# Patient Record
Sex: Female | Born: 1950 | Race: Black or African American | Hispanic: No | Marital: Married | Smoking: Never smoker
Health system: Southern US, Community
[De-identification: ages and names within clinical notes are randomized; demographics above are authoritative.]

## PROBLEM LIST (undated history)

## (undated) DIAGNOSIS — T8859XA Other complications of anesthesia, initial encounter: Secondary | ICD-10-CM

## (undated) DIAGNOSIS — I10 Essential (primary) hypertension: Secondary | ICD-10-CM

## (undated) DIAGNOSIS — Z9889 Other specified postprocedural states: Secondary | ICD-10-CM

## (undated) DIAGNOSIS — R5383 Other fatigue: Secondary | ICD-10-CM

## (undated) DIAGNOSIS — M1990 Unspecified osteoarthritis, unspecified site: Secondary | ICD-10-CM

## (undated) HISTORY — DX: Essential (primary) hypertension: I10

## (undated) HISTORY — DX: Other fatigue: R53.83

---

## 1989-05-01 HISTORY — PX: OTHER SURGICAL HISTORY: SHX169

## 1989-05-01 HISTORY — PX: ENDOSCOPIC EXTRALARYNGEAL VOCAL CORD LATERALIZATION W/ MLB: SHX1502

## 2010-08-30 ENCOUNTER — Ambulatory Visit (INDEPENDENT_AMBULATORY_CARE_PROVIDER_SITE_OTHER): Payer: BC Managed Care – PPO | Admitting: Internal Medicine

## 2010-08-30 DIAGNOSIS — Z800 Family history of malignant neoplasm of digestive organs: Secondary | ICD-10-CM

## 2010-08-30 DIAGNOSIS — K5900 Constipation, unspecified: Secondary | ICD-10-CM

## 2010-08-30 DIAGNOSIS — K6289 Other specified diseases of anus and rectum: Secondary | ICD-10-CM

## 2010-09-18 NOTE — Consult Note (Addendum)
Tara Sutton, Tara Sutton             ACCOUNT NO.:  [id number]  MEDICAL RECORD NO.:  [id number]           PATIENT TYPE:  LOCATION:                                 FACILITY:  PHYSICIAN:  Lionel December, M.D.    DATE OF BIRTH:  18-Jun-1950  DATE OF CONSULTATION:  08/30/2010 DATE OF DISCHARGE:                                CONSULTATION   REASON FOR CONSULTATION:  Constipation and pain when sitting.  HISTORY OF PRESENT ILLNESS:  Tara Sutton is a 60 year old female referred to our office by Dr. Sherril Croon.  She states she is having a hard time sitting on a metal chair.  She says she has discomfort in her rectum when seated. She has had symptoms for 7-9 months.  She says the pain or discomfort is better when she walks.  She denies any rectal bleeding.  She does state she had 1 black stool about 4-5 months ago when she was constipated and that has since resolved.  She did have constipation several months ago and was started on Metamucil which has helped her constipation.  On July 18, 2010, she underwent a pelvic ultrasound for pain when sitting. The ultrasound was limited.  The patient refused a transvaginal ultrasound.  Neither ovary could be visualized.  The uterus was normal. Her last colonoscopy was in 2007 by Dr. Cleotis Nipper for screening purposes.  It revealed no colonic or rectal polyps. Internal hemorrhoids.  No diverticulosis, otherwise normal colonoscopy.  There is a family history of colon cancer. Her mother's sister had colon cancer.  REVIEW OF SYSTEMS:  She denies fever, fatigue.  There has been no weight loss.  Her appetite is good.  Her constipation is better now with a Metamucil.  She was guaiac-negative in Dr. Uvaldo Bristle office.  She recently had a normal Pap smear in February.    ALLERGies: PENICILLIN.  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  She does have a large scar to her right side of the neck.  Medical history includes  hypertension.  FAMILY HISTORY:  Her mother is alive with Parkinson's, otherwise in good health.  Her father is alive in good health with arthritis.  One sister and one brother in good health and her mother, sister has had colon cancer.  SOCIAL HISTORY:  She is married.  She works at [employer].  She does not smoke, drink or do drugs.  She has 3 children in good health.  HOME MEDICATIONS:  Include 1. Hydrochlorothiazide 25 mg a day. 2. Quinapril 40 mg a day. 3. MVI one a day. 4. Aspirin 81 mg a day. 5. Metamucil once a day.  OBJECTIVE:  VITAL SIGNS:  Her weight is 204, her height is 5 feet 5 inches, her BMI is 35, her temperature is 97.3, her blood pressure is 104/76, pulse 72. HEENT:  She has natural teeth in good condition.  Her oral mucosa is moist.  Her conjunctivae is pink.  Sclerae anicteric.  Her thyroid is normal.  There is no cervical lymphadenopathy.  She does have a large scar to the right side of her neck. LUNGS:  Clear. HEART:  Regular rate and rhythm. ABDOMEN:  Soft.  Bowel sounds are positive.  No masses felt.  No hepatosplenomegaly. RECTUM:  Her stool was brown guaiac-negative.  She has normal anal sphincter tone.  No masses felt in the rectum.  LABORATORY DATA:  July 10, 2010, her glucose 98, BUN 19, creatinine 0.99, sodium 141, potassium 3.7, chloride 97, carbon dioxide total 24, calcium 9.6, protein 7.4, albumin 4.5, globulin 2.9, total bilirubin 0.4, ALP 107, AST 20, ALT 18.  WBC count 4.5, RBC 4.15, hemoglobin 12.6, hematocrit 36.0, MCV 87, platelets 259.  Cholesterol was normal at 196, triglycerides 59, HDL was 78, LDL was slightly high at 106.  OBJECTIVE:  Tara Sutton is a 60 year old female presenting today with complaints of pain when she sits, especially in a metal chair.  There is no pain when she walks.  She does have this pain every day.  Her last colonoscopy was in 2007 which was essentially normal.  There is, however, a family history of colonic  cancer.  A rectal carcinoma, AVM or colonic polyp need to be ruled out.  Internal hemorrhoids is also in the differential.  RECOMMENDATIONS:  We will schedule a colonoscopy in near future.  The risk and benefits were reviewed with the patient and she is agreeable. I also recommend her to try Anusol suppositories for her rectal pain until we can get the colonoscopy.  Thank you for allowing [place] to participate in the care of this nice lady.    ______________________________ Dorene Ar, NP   ______________________________ Lionel December, M.D.    TS/MEDQ  D:  08/30/2010  T:  08/31/2010  Job:  [id number]  cc:   Dr. Trisha Mangle  Electronically Signed by Dorene Ar PA on 09/23/2010 09:56:19 AM Electronically Signed by Lionel December M.D. on 10/18/2010 09:28:31 AM

## 2010-09-22 ENCOUNTER — Ambulatory Visit (HOSPITAL_COMMUNITY)
Admission: RE | Admit: 2010-09-22 | Discharge: 2010-09-22 | Disposition: A | Discharge status: Home / Self Care | Payer: BC Managed Care – PPO | Source: Ambulatory Visit | Attending: Internal Medicine | Admitting: Internal Medicine

## 2010-09-22 ENCOUNTER — Encounter (HOSPITAL_BASED_OUTPATIENT_CLINIC_OR_DEPARTMENT_OTHER): Payer: BC Managed Care – PPO | Admitting: Internal Medicine

## 2010-09-22 DIAGNOSIS — Z79899 Other long term (current) drug therapy: Secondary | ICD-10-CM | POA: Insufficient documentation

## 2010-09-22 DIAGNOSIS — K5909 Other constipation: Secondary | ICD-10-CM | POA: Insufficient documentation

## 2010-09-22 DIAGNOSIS — K5900 Constipation, unspecified: Secondary | ICD-10-CM

## 2010-09-22 DIAGNOSIS — I10 Essential (primary) hypertension: Secondary | ICD-10-CM | POA: Insufficient documentation

## 2010-09-22 DIAGNOSIS — K6289 Other specified diseases of anus and rectum: Secondary | ICD-10-CM

## 2010-09-22 DIAGNOSIS — Z800 Family history of malignant neoplasm of digestive organs: Secondary | ICD-10-CM

## 2010-09-22 DIAGNOSIS — Z7982 Long term (current) use of aspirin: Secondary | ICD-10-CM | POA: Insufficient documentation

## 2010-09-22 DIAGNOSIS — K644 Residual hemorrhoidal skin tags: Secondary | ICD-10-CM | POA: Insufficient documentation

## 2010-10-18 NOTE — Op Note (Signed)
  Tara Sutton, Tara Sutton             ACCOUNT NO.:  [id number]  MEDICAL RECORD NO.:  [id number]           PATIENT TYPE:  O  LOCATION:  [facility]                          FACILITY:  [facility]  PHYSICIAN:  Lionel December, M.D.    DATE OF BIRTH:  25-Sep-1950  DATE OF PROCEDURE:  09/22/2010 DATE OF DISCHARGE:                              OPERATIVE REPORT   PROCEDURE:  Colonoscopy.  INDICATION:  Vibha 60 year old [place] American female who presents with constipation and rectal pain.  Her last colonoscopy was over 5 years ago.  Family history is positive for colon carcinoma, but in her second- degree relatives, her paternal grandmother had colon carcinoma, probably around 41, but she had a maternal aunt died of colon carcinoma at 89 or 55.  She is undergoing colonoscopy primarily for diagnostic reasons. Procedures were reviewed with the patient.  Informed consent was obtained.  MEDS FOR CONSCIOUS SEDATION:  Demerol 50 mg IV, Versed 5 mg IV in divided dose.  FINDINGS:  Procedure performed in endoscopy suite.  The patient's vital signs and O2 sat were monitored during the procedure and remained stable.  The patient was placed in left lateral recumbent position and rectal examination performed.  No abnormality noted on external or digital exam.  Pentax videoscope was placed through rectum and advanced under vision into sigmoid colon and beyond.  Preparation was excellent. Somewhat dilated colon.  Redundant hepatic flexure, but the scope was advanced into cecum which was identified by appendiceal orifice and ileocecal valve.  Pictures were taken for the record.  As the scope was withdrawn, colonic mucosa was carefully examined.  No polyps, tumor masses, or other abnormalities were noted.  Rectal mucosa was normal. Scope was retroflexed to examine anorectal junction and small hemorrhoids noted below the dentate line.  Endoscope was withdrawn. Withdrawal time was 7 minutes.  The patient tolerated the  procedure well.  FINAL DIAGNOSES: 1. Examination performed to cecum. 2. Redundant hepatic flexure and small external hemorrhoids, otherwise     normal examination.  RECOMMENDATION:  She will continue high-fiber diet, Metamucil daily.  If need maybe she can take Colace 2 tablets daily at bedtime.  If above measures do not help, she may need to take MiraLax.  She may consider next exam in 7-10 years.          ______________________________ Lionel December, M.D.     NR/MEDQ  D:  09/22/2010  T:  09/23/2010  Job:  [id number]  cc:   Dr. Trisha Mangle  Electronically Signed by Lionel December M.D. on 10/18/2010 09:30:57 AM

## 2017-03-14 ENCOUNTER — Encounter: Payer: Self-pay | Admitting: Vascular Surgery

## 2017-05-14 ENCOUNTER — Ambulatory Visit: Payer: 59 | Admitting: Vascular Surgery

## 2017-05-14 ENCOUNTER — Encounter: Payer: Self-pay | Admitting: Vascular Surgery

## 2017-05-14 VITALS — BP 130/84 | HR 77 | Temp 98.2°F | Resp 18 | Ht 64.0 in | Wt 204.5 lb

## 2017-05-14 DIAGNOSIS — I83893 Varicose veins of bilateral lower extremities with other complications: Secondary | ICD-10-CM | POA: Diagnosis not present

## 2017-05-14 NOTE — Progress Notes (Signed)
Subjective:     Patient ID: Tara Sutton, female   DOB: [birth year], 67 y.o.   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 The patient has had no previous treatment. The patient has no history of DVT thrombophlebitis stasis ulcers or bleeding. She does not develop swelling as the day progresses. She does not really last compression stockings. She works [employer] and is on her feet most of the day. She has noticed increasingly prominent veins in both legs over the past few years. She denies any pain or symptoms related to these. She has had no previous treatment.  Past Medical History:  Diagnosis Date  . Fatigue   . Hypertension     Social History   Tobacco Use  . Smoking status: Never Smoker  . Smokeless tobacco: Never Used  Substance Use Topics  . Alcohol use: No    Frequency: Never    Family History  Problem Relation Age of Onset  . Diabetes Mother   . Cancer Mother   . Parkinsonism Mother   . Diabetes Sister     Allergies  Allergen Reactions  . Penicillins      Current Outpatient Medications:  .  aspirin EC 81 MG tablet, Take 81 mg daily by mouth., Disp: , Rfl:  .  docusate sodium (COLACE) 100 MG capsule, Take 100 mg 2 (two) times daily by mouth., Disp: , Rfl:  .  hydrochlorothiazide (HYDRODIURIL) 25 MG tablet, Take 25 mg daily by mouth., Disp: , Rfl:  .  meclizine (ANTIVERT) 25 MG tablet, Take 25 mg 3 (three) times daily as needed by mouth for dizziness., Disp: , Rfl:  .  Multiple Vitamin (MULTIVITAMIN) capsule, Take 1 capsule daily by mouth., Disp: , Rfl:  .  quinapril (ACCUPRIL) 40 MG tablet, Take 40 mg at bedtime by mouth., Disp: , Rfl:  .  vitamin B-12 (CYANOCOBALAMIN) 1000 MCG tablet, Take 1,000 mcg daily by mouth., Disp: , Rfl:   Vitals:   05/14/17 1306  BP: 130/84  Pulse: 77  Resp: 18  Temp: 98.2 F (36.8 C)  TempSrc: Oral  Weight: 204 lb 8 oz (92.8 kg)  Height: 5\' 4"   (1.626 m)    Body mass index is 35.1 kg/m.         Review of Systems  Denies chest pain, dyspnea on exertion, PND, orthopnea, hemoptysis, claudication. Has history of hypertension treated medically.    Objective:   Physical Exam BP 130/84 (BP Location: Left Arm, Patient Position: Sitting, Cuff Size: Large)   Pulse 77   Temp 98.2 F (36.8 C) (Oral)   Resp 18   Ht 5\' 4"  (1.626 m)   Wt 204 lb 8 oz (92.8 kg)   BMI 35.10 kg/m     Gen.-alert and oriented x3 in no apparent distress HEENT normal for age Lungs no rhonchi or wheezing Cardiovascular regular rhythm no murmurs carotid pulses 3+ palpable no bruits audible Abdomen soft nontender no palpable masses Musculoskeletal free of  major deformities Skin clear -no rashes Neurologic normal Lower extremities 3+ femoral and dorsalis pedis pulses palpable bilaterally with no edema Patient has diffuse spider and small varicosities bilaterally in the left leg in the patellar area and bilateral medial and lateral thigh areas. No hyperpigmentation or ulceration noted. No large bulging varicosities noted.  Today I performed a bedside SonoSite ultrasound exam. Both great saphenous and small saphenous veins appear normal in size with no evidence of reflux or  enlargement.     Assessment:     Bilateral small varicosities and spider telangiectasias-asymptomatic with no evidence of gross reflux in bilateral great or small saphenous veins Hypertension treated medically    Plan:     Discussed with patient the treatment would not be medically indicated but if she desires treatment it would consist of foam sclerotherapy. She was given contact information for [facility] if she should decide she was to pursue this further

## 2017-05-16 ENCOUNTER — Encounter: Payer: Self-pay | Admitting: Nurse Practitioner

## 2017-08-01 ENCOUNTER — Ambulatory Visit (HOSPITAL_COMMUNITY)
Admission: RE | Admit: 2017-08-01 | Discharge: 2017-08-01 | Disposition: A | Discharge status: Home / Self Care | Payer: 59 | Source: Ambulatory Visit | Attending: Nurse Practitioner | Admitting: Nurse Practitioner

## 2017-08-01 ENCOUNTER — Other Ambulatory Visit (HOSPITAL_COMMUNITY): Payer: Self-pay | Admitting: Nurse Practitioner

## 2017-08-01 ENCOUNTER — Other Ambulatory Visit (HOSPITAL_COMMUNITY)
Admission: RE | Admit: 2017-08-01 | Discharge: 2017-08-01 | Disposition: A | Discharge status: Home / Self Care | Payer: 59 | Source: Ambulatory Visit | Attending: Internal Medicine | Admitting: Internal Medicine

## 2017-08-01 DIAGNOSIS — M1711 Unilateral primary osteoarthritis, right knee: Secondary | ICD-10-CM | POA: Diagnosis not present

## 2017-08-01 DIAGNOSIS — M25561 Pain in right knee: Secondary | ICD-10-CM

## 2017-08-01 DIAGNOSIS — M79604 Pain in right leg: Secondary | ICD-10-CM | POA: Insufficient documentation

## 2017-08-01 DIAGNOSIS — M79661 Pain in right lower leg: Secondary | ICD-10-CM

## 2017-08-01 DIAGNOSIS — R69 Illness, unspecified: Secondary | ICD-10-CM | POA: Insufficient documentation

## 2017-08-01 DIAGNOSIS — R600 Localized edema: Secondary | ICD-10-CM

## 2018-06-28 DIAGNOSIS — E7800 Pure hypercholesterolemia, unspecified: Secondary | ICD-10-CM | POA: Diagnosis not present

## 2018-06-28 DIAGNOSIS — I10 Essential (primary) hypertension: Secondary | ICD-10-CM | POA: Diagnosis not present

## 2018-06-28 DIAGNOSIS — I8393 Asymptomatic varicose veins of bilateral lower extremities: Secondary | ICD-10-CM | POA: Diagnosis not present

## 2018-06-28 DIAGNOSIS — Z299 Encounter for prophylactic measures, unspecified: Secondary | ICD-10-CM | POA: Diagnosis not present

## 2018-06-28 DIAGNOSIS — Z6835 Body mass index (BMI) 35.0-35.9, adult: Secondary | ICD-10-CM | POA: Diagnosis not present

## 2018-10-03 IMAGING — US US EXTREM LOW VENOUS*R*
1 series · 13 of 24 positions shown · non-contrast
Comparison: None.

CLINICAL DATA: Right lower extremity pain and edema since this
morning. Evaluate for DVT.



[Series 1: us extrem low venous*right* · 0.08mm/px · 13 of 35 slices shown]
[im 1/35]
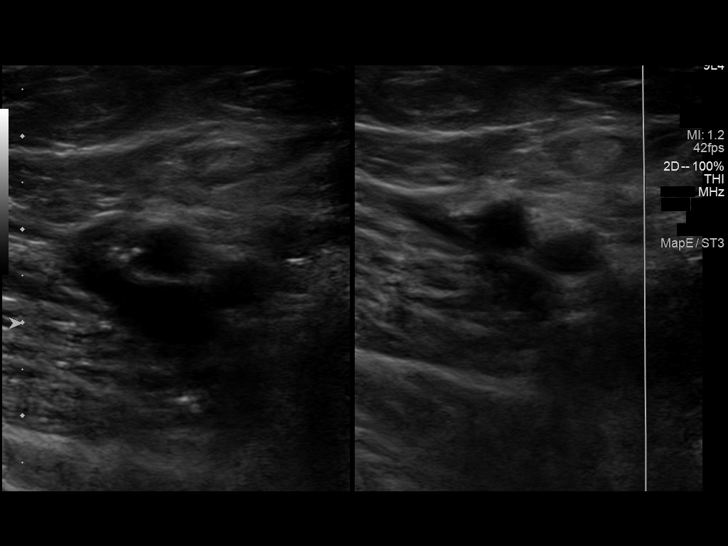
[im 3/35]
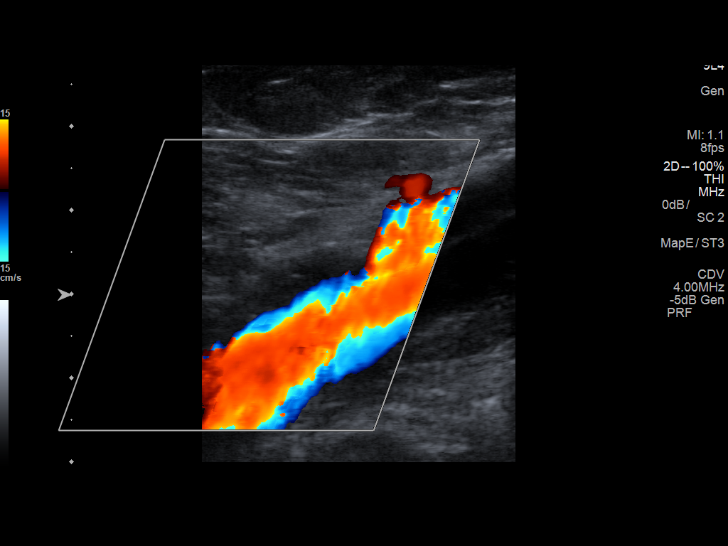
[im 6/35]
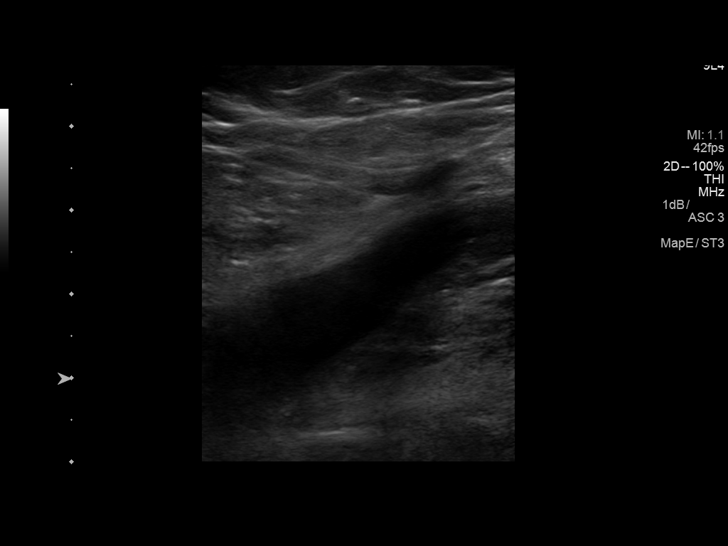
[im 9/35]
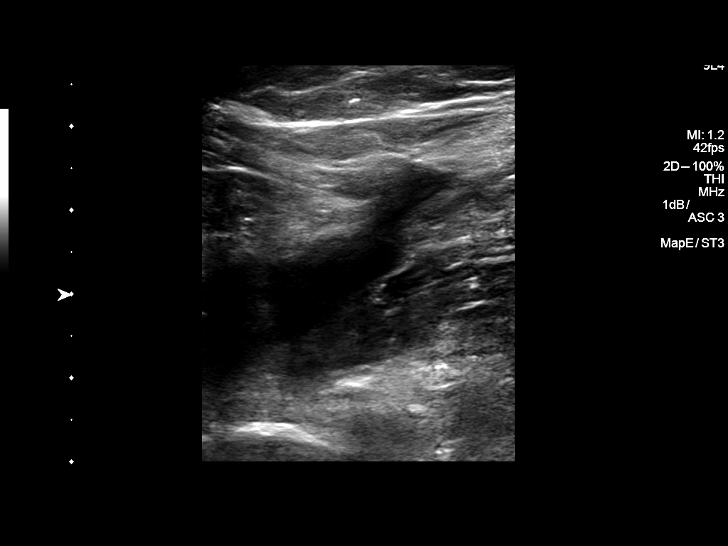
[im 12/35]
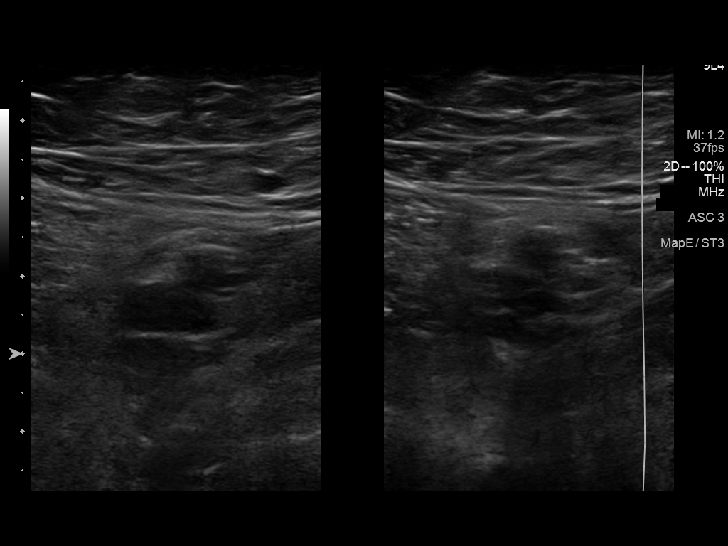
[im 15/35]
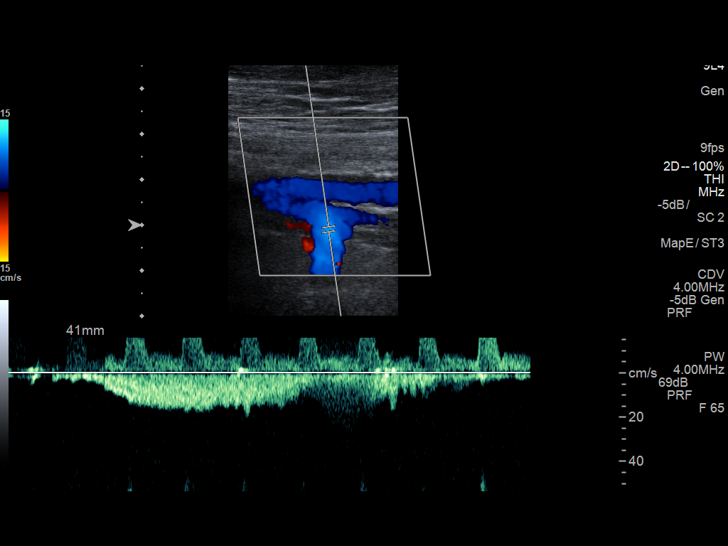
[im 18/35]
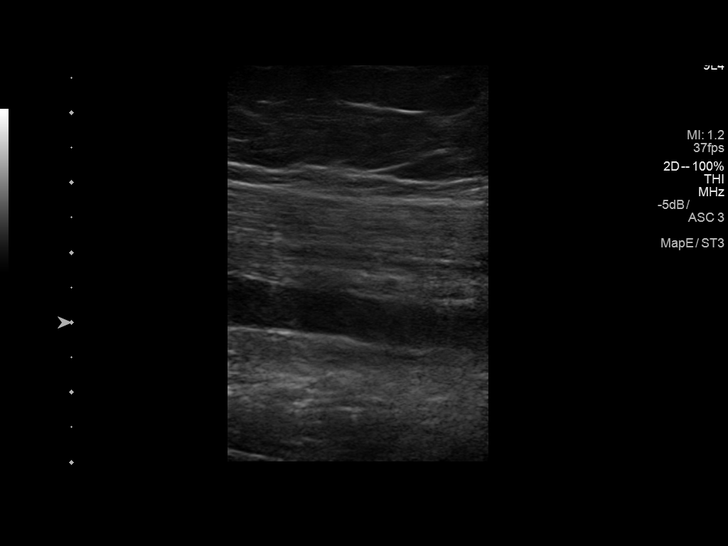
[im 20/35]
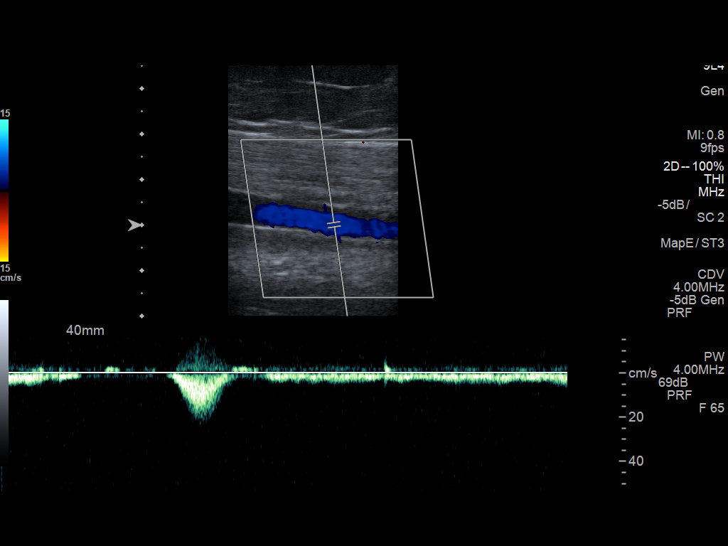
[im 23/35]
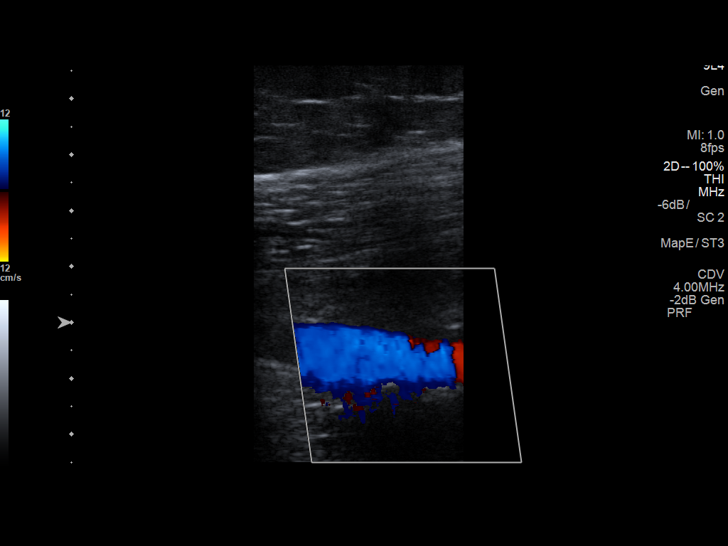
[im 26/35]
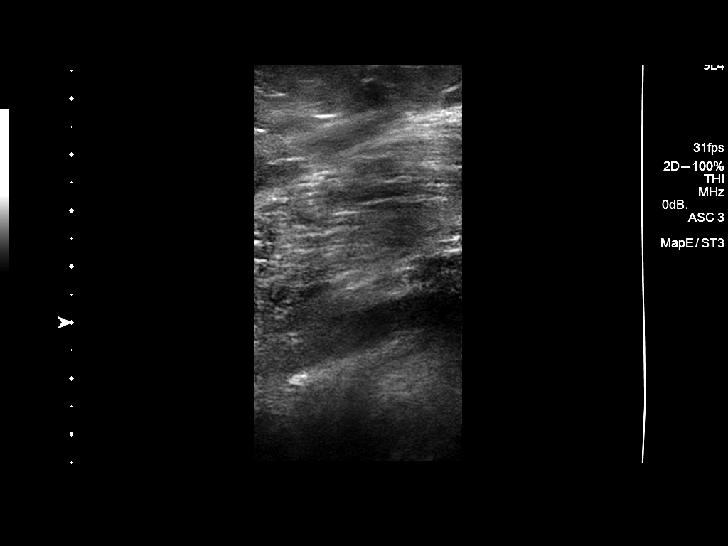
[im 29/35]
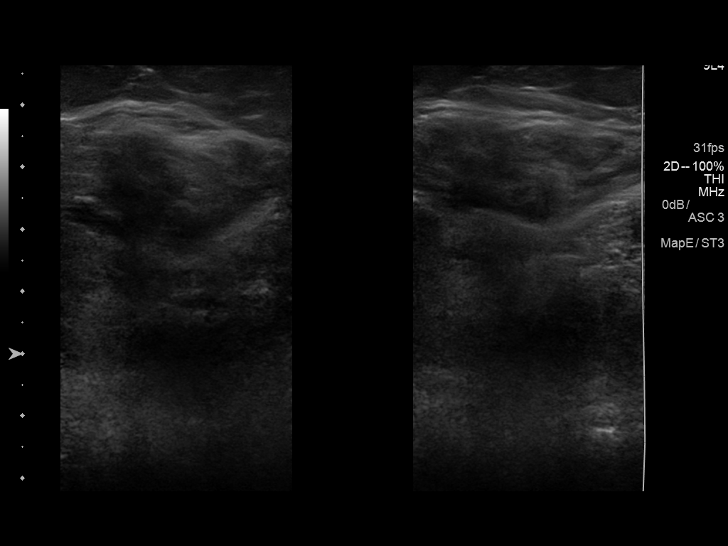
[im 32/35]
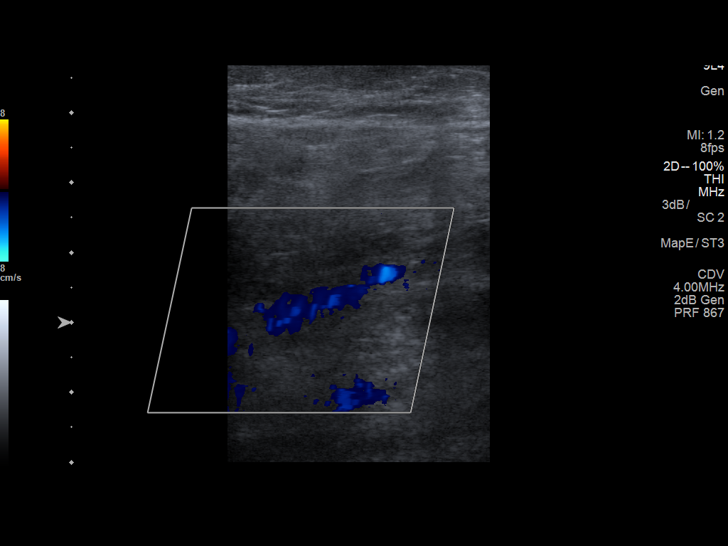
[im 35/35]
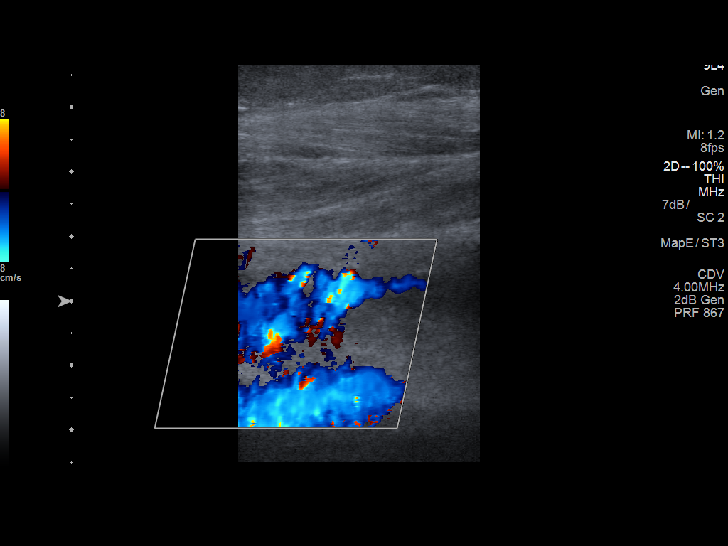

[13 of 24 positions shown; findings below may reference images not displayed]

FINDINGS: Contralateral Common Femoral Vein: Respiratory phasicity is normal
and symmetric with the symptomatic side. No evidence of thrombus.
Normal compressibility.

Common Femoral Vein: No evidence of thrombus. Normal
compressibility, respiratory phasicity and response to augmentation.

Saphenofemoral Junction: No evidence of thrombus. Normal
compressibility and flow on color Doppler imaging.

Profunda Femoral Vein: No evidence of thrombus. Normal
compressibility and flow on color Doppler imaging.

Femoral Vein: No evidence of thrombus. Normal compressibility,
respiratory phasicity and response to augmentation.

Popliteal Vein: No evidence of thrombus. Normal compressibility,
respiratory phasicity and response to augmentation.

Calf Veins: No evidence of thrombus. Normal compressibility and flow
on color Doppler imaging.

Superficial Great Saphenous Vein: No evidence of thrombus. Normal
compressibility.

Venous Reflux:  None.

Other Findings:  None.
IMPRESSION: No evidence of DVT within the right lower extremity.

## 2018-10-30 DIAGNOSIS — Z0000 Encounter for general adult medical examination without abnormal findings: Secondary | ICD-10-CM | POA: Diagnosis not present

## 2018-10-30 DIAGNOSIS — E7800 Pure hypercholesterolemia, unspecified: Secondary | ICD-10-CM | POA: Diagnosis not present

## 2018-10-30 DIAGNOSIS — Z79899 Other long term (current) drug therapy: Secondary | ICD-10-CM | POA: Diagnosis not present

## 2018-11-15 DIAGNOSIS — Z1231 Encounter for screening mammogram for malignant neoplasm of breast: Secondary | ICD-10-CM | POA: Diagnosis not present

## 2019-03-16 IMAGING — DX DG KNEE COMPLETE 4+V*R*
4 series · 4 of 4 positions shown · non-contrast
Comparison: None.

CLINICAL DATA: Right knee pain for several hours, no known injury,
initial encounter

EXAM:
RIGHT KNEE - COMPLETE 4+ VIEW

[knee ap (1 of 3)]
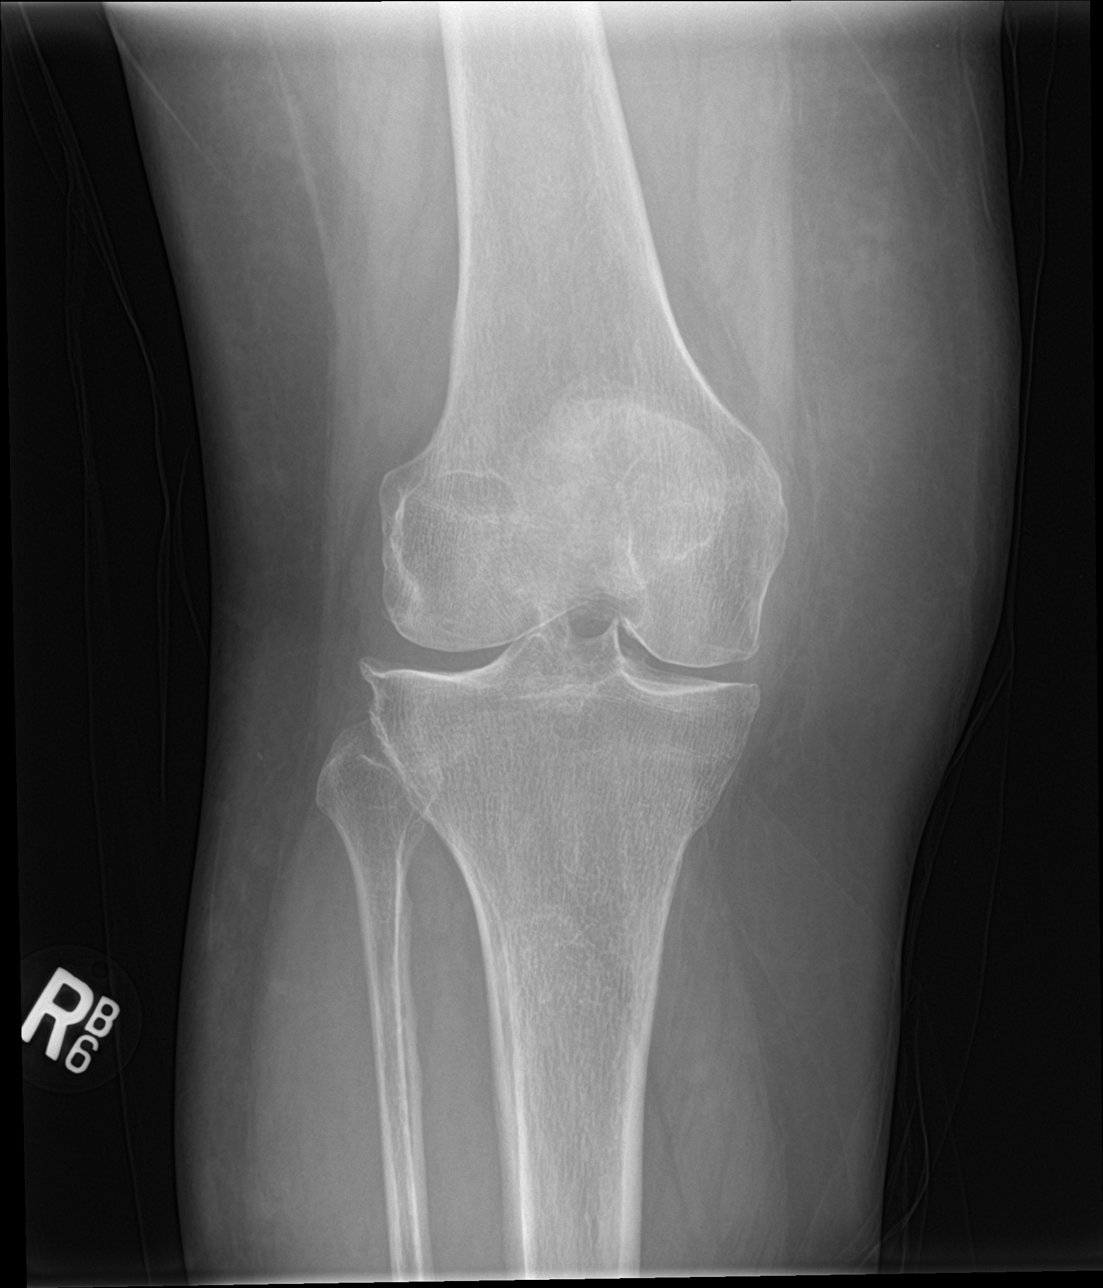

[knee lat]
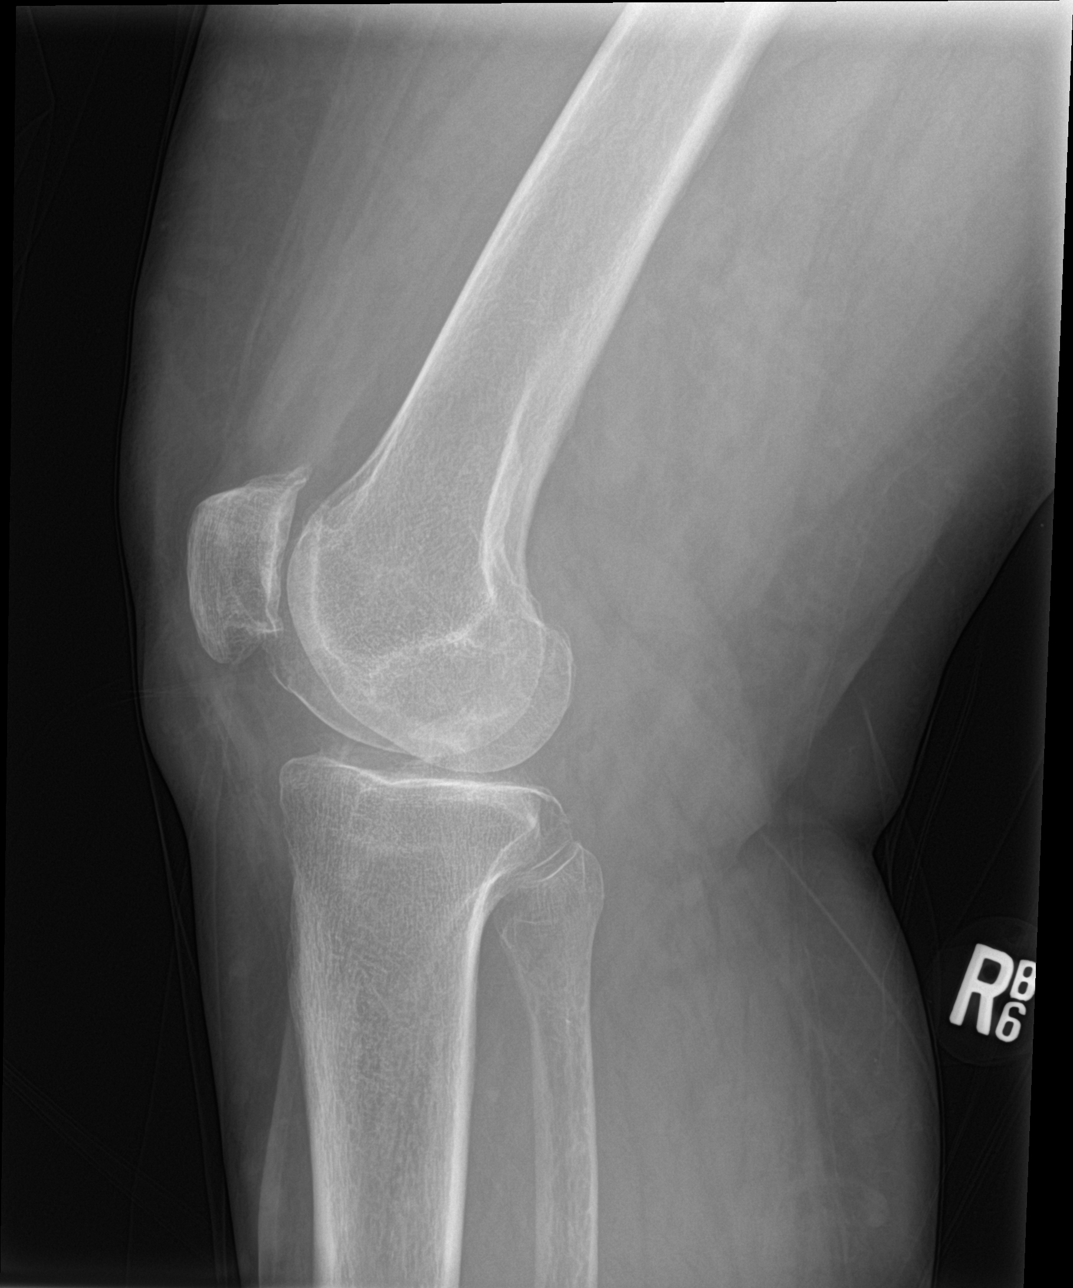

[knee ap (2 of 3)]
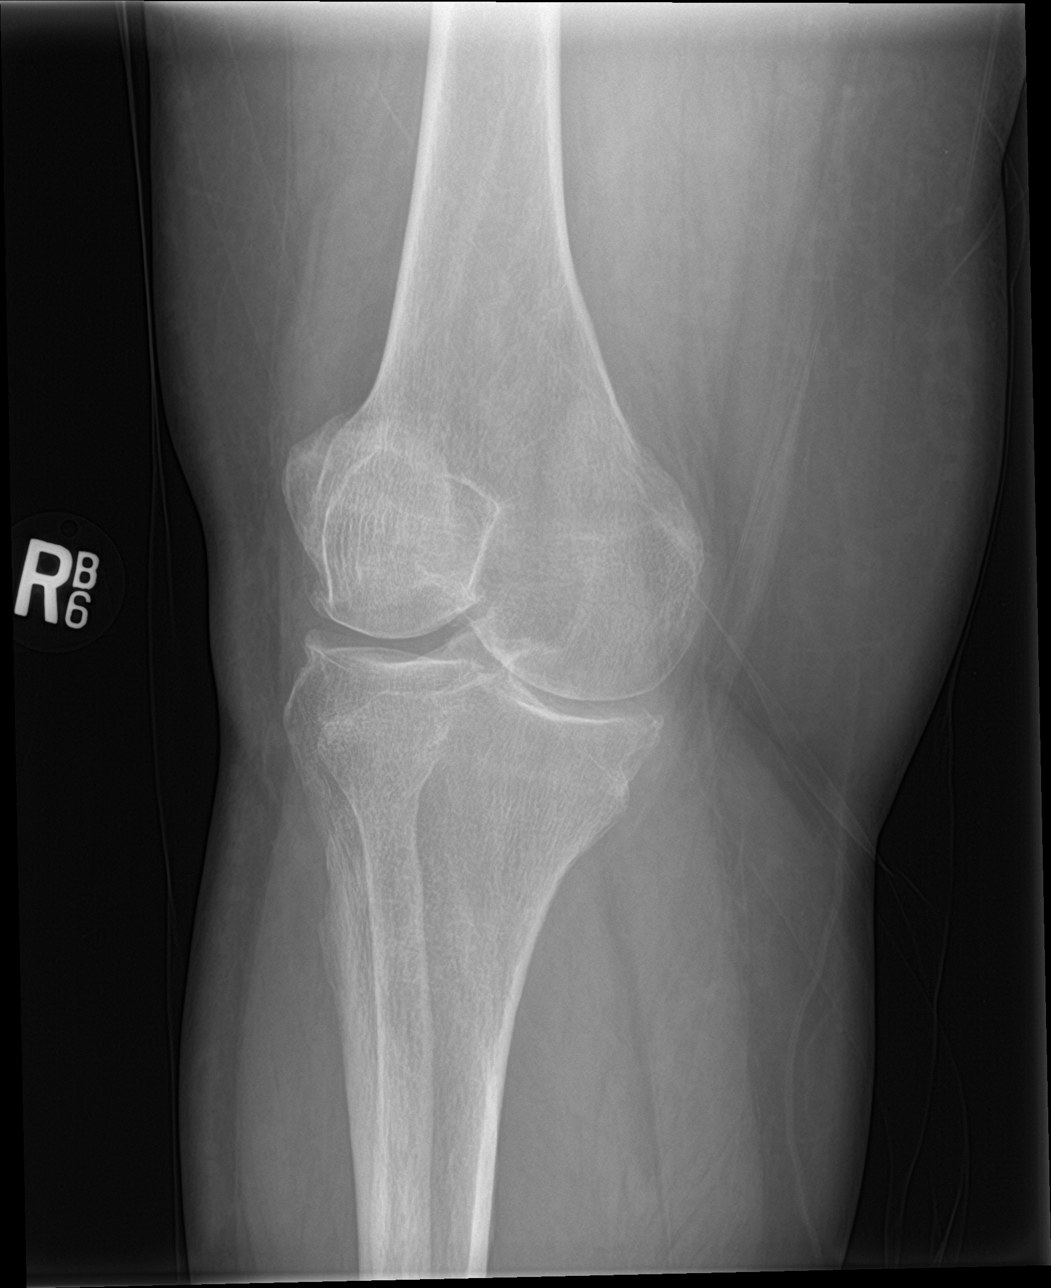

[knee ap (3 of 3)]
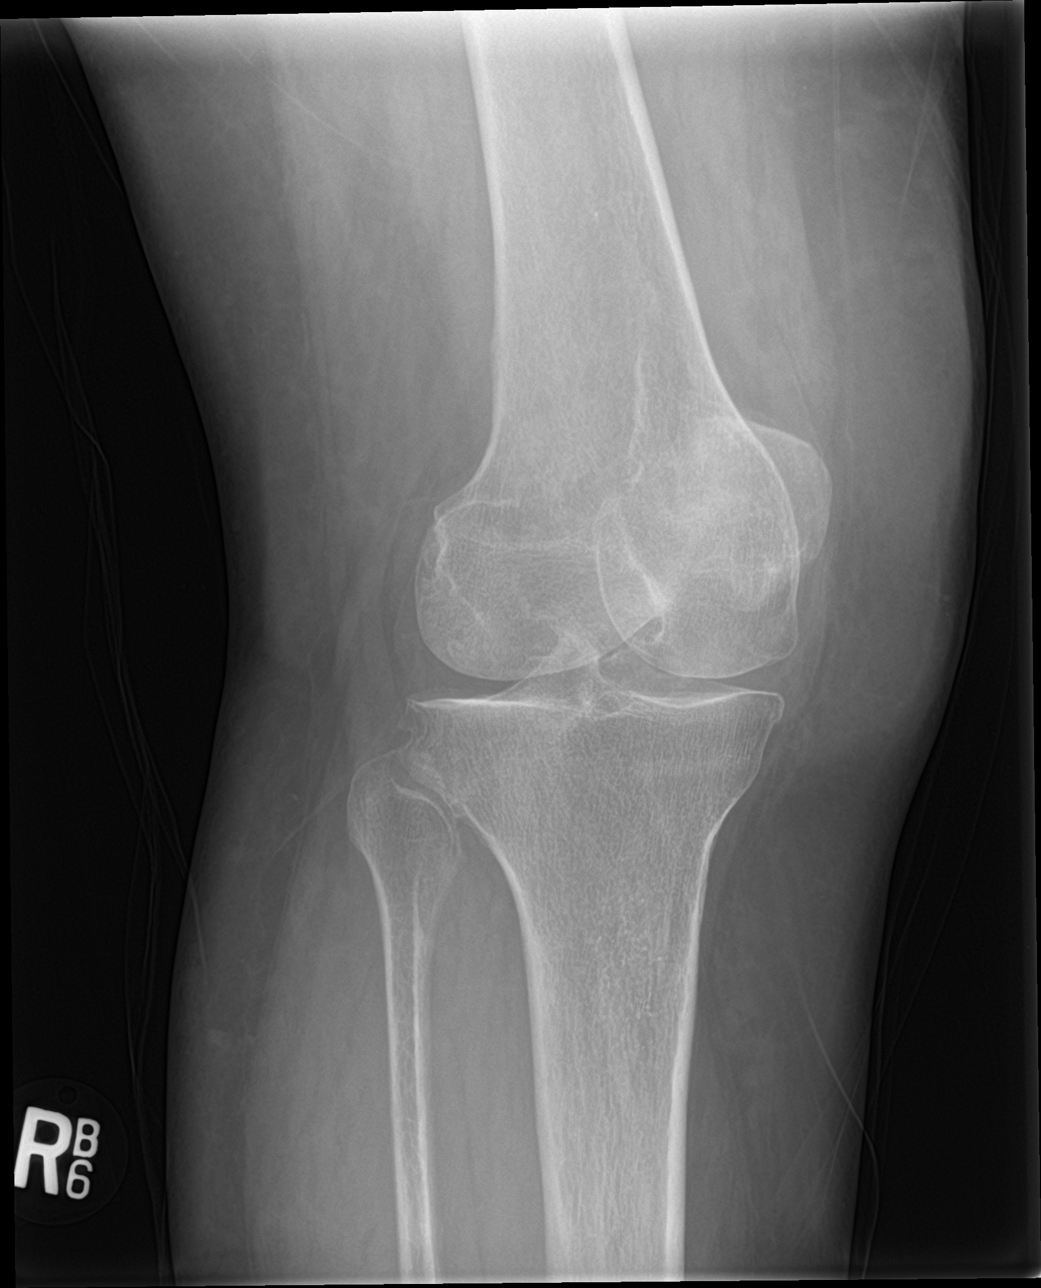

[4 of 4 positions shown; findings below may reference images not displayed]

FINDINGS: Tricompartmental degenerative changes are noted. No joint effusion
is seen. No acute fracture or dislocation is noted.
IMPRESSION: Degenerative change without acute abnormality.

## 2019-05-16 DIAGNOSIS — E7800 Pure hypercholesterolemia, unspecified: Secondary | ICD-10-CM | POA: Diagnosis not present

## 2019-05-16 DIAGNOSIS — I8393 Asymptomatic varicose veins of bilateral lower extremities: Secondary | ICD-10-CM | POA: Diagnosis not present

## 2019-05-16 DIAGNOSIS — Z6836 Body mass index (BMI) 36.0-36.9, adult: Secondary | ICD-10-CM | POA: Diagnosis not present

## 2019-05-16 DIAGNOSIS — Z789 Other specified health status: Secondary | ICD-10-CM | POA: Diagnosis not present

## 2019-05-16 DIAGNOSIS — Z299 Encounter for prophylactic measures, unspecified: Secondary | ICD-10-CM | POA: Diagnosis not present

## 2019-05-16 DIAGNOSIS — I10 Essential (primary) hypertension: Secondary | ICD-10-CM | POA: Diagnosis not present

## 2019-06-10 DIAGNOSIS — Z299 Encounter for prophylactic measures, unspecified: Secondary | ICD-10-CM | POA: Diagnosis not present

## 2019-06-10 DIAGNOSIS — Z713 Dietary counseling and surveillance: Secondary | ICD-10-CM | POA: Diagnosis not present

## 2019-06-10 DIAGNOSIS — M79661 Pain in right lower leg: Secondary | ICD-10-CM | POA: Diagnosis not present

## 2019-06-10 DIAGNOSIS — Z6836 Body mass index (BMI) 36.0-36.9, adult: Secondary | ICD-10-CM | POA: Diagnosis not present

## 2019-06-10 DIAGNOSIS — I10 Essential (primary) hypertension: Secondary | ICD-10-CM | POA: Diagnosis not present

## 2019-06-16 DIAGNOSIS — Z23 Encounter for immunization: Secondary | ICD-10-CM | POA: Diagnosis not present

## 2019-06-18 DIAGNOSIS — G8929 Other chronic pain: Secondary | ICD-10-CM | POA: Diagnosis not present

## 2019-06-18 DIAGNOSIS — M25661 Stiffness of right knee, not elsewhere classified: Secondary | ICD-10-CM | POA: Diagnosis not present

## 2019-06-18 DIAGNOSIS — M25561 Pain in right knee: Secondary | ICD-10-CM | POA: Diagnosis not present

## 2019-06-27 DIAGNOSIS — M25561 Pain in right knee: Secondary | ICD-10-CM | POA: Diagnosis not present

## 2019-06-27 DIAGNOSIS — M1711 Unilateral primary osteoarthritis, right knee: Secondary | ICD-10-CM | POA: Diagnosis not present

## 2019-06-27 DIAGNOSIS — S83271A Complex tear of lateral meniscus, current injury, right knee, initial encounter: Secondary | ICD-10-CM | POA: Diagnosis not present

## 2019-07-04 DIAGNOSIS — M1711 Unilateral primary osteoarthritis, right knee: Secondary | ICD-10-CM | POA: Diagnosis not present

## 2019-07-04 DIAGNOSIS — M25661 Stiffness of right knee, not elsewhere classified: Secondary | ICD-10-CM | POA: Diagnosis not present

## 2019-07-14 DIAGNOSIS — Z23 Encounter for immunization: Secondary | ICD-10-CM | POA: Diagnosis not present

## 2019-07-19 DIAGNOSIS — M542 Cervicalgia: Secondary | ICD-10-CM | POA: Diagnosis not present

## 2019-08-22 DIAGNOSIS — I10 Essential (primary) hypertension: Secondary | ICD-10-CM | POA: Diagnosis not present

## 2019-08-22 DIAGNOSIS — Z299 Encounter for prophylactic measures, unspecified: Secondary | ICD-10-CM | POA: Diagnosis not present

## 2019-11-18 DIAGNOSIS — E2839 Other primary ovarian failure: Secondary | ICD-10-CM | POA: Diagnosis not present

## 2019-11-18 DIAGNOSIS — Z79899 Other long term (current) drug therapy: Secondary | ICD-10-CM | POA: Diagnosis not present

## 2019-12-10 DIAGNOSIS — L663 Perifolliculitis capitis abscedens: Secondary | ICD-10-CM | POA: Diagnosis not present

## 2019-12-10 DIAGNOSIS — L723 Sebaceous cyst: Secondary | ICD-10-CM | POA: Diagnosis not present

## 2019-12-25 DIAGNOSIS — D485 Neoplasm of uncertain behavior of skin: Secondary | ICD-10-CM | POA: Diagnosis not present

## 2019-12-25 DIAGNOSIS — L7211 Pilar cyst: Secondary | ICD-10-CM | POA: Diagnosis not present

## 2020-02-06 DIAGNOSIS — Z713 Dietary counseling and surveillance: Secondary | ICD-10-CM | POA: Diagnosis not present

## 2020-02-06 DIAGNOSIS — Z299 Encounter for prophylactic measures, unspecified: Secondary | ICD-10-CM | POA: Diagnosis not present

## 2020-02-06 DIAGNOSIS — I10 Essential (primary) hypertension: Secondary | ICD-10-CM | POA: Diagnosis not present

## 2020-02-18 DIAGNOSIS — L7211 Pilar cyst: Secondary | ICD-10-CM | POA: Diagnosis not present

## 2020-02-18 DIAGNOSIS — L910 Hypertrophic scar: Secondary | ICD-10-CM | POA: Diagnosis not present

## 2020-05-04 DIAGNOSIS — Z23 Encounter for immunization: Secondary | ICD-10-CM | POA: Diagnosis not present

## 2020-05-11 DIAGNOSIS — Z789 Other specified health status: Secondary | ICD-10-CM | POA: Diagnosis not present

## 2020-05-11 DIAGNOSIS — I10 Essential (primary) hypertension: Secondary | ICD-10-CM | POA: Diagnosis not present

## 2020-05-11 DIAGNOSIS — M25561 Pain in right knee: Secondary | ICD-10-CM | POA: Diagnosis not present

## 2020-05-11 DIAGNOSIS — Z299 Encounter for prophylactic measures, unspecified: Secondary | ICD-10-CM | POA: Diagnosis not present

## 2020-05-11 DIAGNOSIS — Z6835 Body mass index (BMI) 35.0-35.9, adult: Secondary | ICD-10-CM | POA: Diagnosis not present

## 2020-05-11 DIAGNOSIS — Z2821 Immunization not carried out because of patient refusal: Secondary | ICD-10-CM | POA: Diagnosis not present

## 2020-05-21 DIAGNOSIS — M1711 Unilateral primary osteoarthritis, right knee: Secondary | ICD-10-CM | POA: Diagnosis not present

## 2020-05-24 DIAGNOSIS — M7062 Trochanteric bursitis, left hip: Secondary | ICD-10-CM | POA: Diagnosis not present

## 2020-05-24 DIAGNOSIS — M545 Low back pain, unspecified: Secondary | ICD-10-CM | POA: Diagnosis not present

## 2020-05-24 DIAGNOSIS — M1711 Unilateral primary osteoarthritis, right knee: Secondary | ICD-10-CM | POA: Diagnosis not present

## 2020-06-25 DIAGNOSIS — M545 Low back pain, unspecified: Secondary | ICD-10-CM | POA: Diagnosis not present

## 2020-06-25 DIAGNOSIS — M1711 Unilateral primary osteoarthritis, right knee: Secondary | ICD-10-CM | POA: Diagnosis not present

## 2020-06-25 DIAGNOSIS — M7062 Trochanteric bursitis, left hip: Secondary | ICD-10-CM | POA: Diagnosis not present

## 2020-06-28 DIAGNOSIS — M7072 Other bursitis of hip, left hip: Secondary | ICD-10-CM | POA: Diagnosis not present

## 2020-06-28 DIAGNOSIS — Z299 Encounter for prophylactic measures, unspecified: Secondary | ICD-10-CM | POA: Diagnosis not present

## 2020-06-28 DIAGNOSIS — M545 Low back pain, unspecified: Secondary | ICD-10-CM | POA: Diagnosis not present

## 2020-06-28 DIAGNOSIS — I10 Essential (primary) hypertension: Secondary | ICD-10-CM | POA: Diagnosis not present

## 2020-08-19 DIAGNOSIS — Z299 Encounter for prophylactic measures, unspecified: Secondary | ICD-10-CM | POA: Diagnosis not present

## 2020-08-19 DIAGNOSIS — Z6836 Body mass index (BMI) 36.0-36.9, adult: Secondary | ICD-10-CM | POA: Diagnosis not present

## 2020-08-19 DIAGNOSIS — Z713 Dietary counseling and surveillance: Secondary | ICD-10-CM | POA: Diagnosis not present

## 2020-08-19 DIAGNOSIS — I10 Essential (primary) hypertension: Secondary | ICD-10-CM | POA: Diagnosis not present

## 2020-08-25 ENCOUNTER — Encounter (INDEPENDENT_AMBULATORY_CARE_PROVIDER_SITE_OTHER): Payer: Self-pay | Admitting: *Deleted

## 2020-10-27 DIAGNOSIS — Z23 Encounter for immunization: Secondary | ICD-10-CM | POA: Diagnosis not present

## 2021-02-08 DIAGNOSIS — R252 Cramp and spasm: Secondary | ICD-10-CM | POA: Diagnosis not present

## 2021-02-08 DIAGNOSIS — I10 Essential (primary) hypertension: Secondary | ICD-10-CM | POA: Diagnosis not present

## 2021-02-08 DIAGNOSIS — Z6837 Body mass index (BMI) 37.0-37.9, adult: Secondary | ICD-10-CM | POA: Diagnosis not present

## 2021-02-08 DIAGNOSIS — M7730 Calcaneal spur, unspecified foot: Secondary | ICD-10-CM | POA: Diagnosis not present

## 2021-02-08 DIAGNOSIS — Z299 Encounter for prophylactic measures, unspecified: Secondary | ICD-10-CM | POA: Diagnosis not present

## 2021-06-20 DIAGNOSIS — Z6837 Body mass index (BMI) 37.0-37.9, adult: Secondary | ICD-10-CM | POA: Diagnosis not present

## 2021-06-20 DIAGNOSIS — I10 Essential (primary) hypertension: Secondary | ICD-10-CM | POA: Diagnosis not present

## 2021-06-20 DIAGNOSIS — Z789 Other specified health status: Secondary | ICD-10-CM | POA: Diagnosis not present

## 2021-06-20 DIAGNOSIS — Z299 Encounter for prophylactic measures, unspecified: Secondary | ICD-10-CM | POA: Diagnosis not present

## 2021-06-20 DIAGNOSIS — M1711 Unilateral primary osteoarthritis, right knee: Secondary | ICD-10-CM | POA: Diagnosis not present

## 2021-08-05 DIAGNOSIS — M25561 Pain in right knee: Secondary | ICD-10-CM | POA: Diagnosis not present

## 2021-08-05 DIAGNOSIS — M1711 Unilateral primary osteoarthritis, right knee: Secondary | ICD-10-CM | POA: Diagnosis not present

## 2021-09-22 NOTE — Patient Instructions (Signed)
DUE TO COVID-19 ONLY TWO VISITORS  (aged 69 and older)  ARE ALLOWED TO COME WITH YOU AND STAY IN THE WAITING ROOM ONLY DURING PRE OP AND PROCEDURE.   **NO VISITORS ARE ALLOWED IN THE SHORT STAY AREA OR RECOVERY ROOM!!**  IF YOU WILL BE ADMITTED INTO THE HOSPITAL YOU ARE ALLOWED ONLY FOUR SUPPORT PEOPLE DURING VISITATION HOURS ONLY (7 AM -8PM)   The support person(s) must pass our screening, gel in and out, and wear a mask at all times, including in the patient's room. Patients must also wear a mask when staff or their support person are in the room. Visitors GUEST BADGE MUST BE WORN VISIBLY  One adult visitor may remain with you overnight and MUST be in the room by 8 P.M.     Your procedure is scheduled on: 10/04/21   Report to [facility] Main Entrance    Report to admitting at : 7:15 AM   Call this number if you have problems the morning of surgery [phone]   Do not eat food :After Midnight.   After Midnight you may have the following liquids until : 7:00 AM DAY OF SURGERY  Water Black Coffee (sugar ok, NO MILK/CREAM OR CREAMERS)  Tea (sugar ok, NO MILK/CREAM OR CREAMERS) regular and decaf                             Plain Jell-O (NO RED)                                           Fruit ices (not with fruit pulp, NO RED)                                     Popsicles (NO RED)                                                                  Juice: apple, WHITE grape, WHITE cranberry Sports drinks like Gatorade (NO RED) Clear broth(vegetable,chicken,beef)  Drink  Ensure drink AT: 7:00 AM the day of surgery.    The day of surgery:  Drink ONE (1) Pre-Surgery Clear Ensure or G2 at AM the morning of surgery. Drink in one sitting. Do not sip.  This drink was given to you during your hospital  pre-op appointment visit. Nothing else to drink after completing the  Pre-Surgery Clear Ensure or G2.          If you have questions, please contact your surgeon's office.   Oral  Hygiene is also important to reduce your risk of infection.                                    Remember - BRUSH YOUR TEETH THE MORNING OF SURGERY WITH YOUR REGULAR TOOTHPASTE   Do NOT smoke after Midnight   Take these medicines the morning of surgery with A SIP OF WATER: N/A  DO NOT TAKE ANY ORAL DIABETIC MEDICATIONS  DAY OF YOUR SURGERY  Bring CPAP mask and tubing day of surgery.                              You may not have any metal on your body including hair pins, jewelry, and body piercing             Do not wear make-up, lotions, powders, perfumes/cologne, or deodorant  Do not wear nail polish including gel and S&S, artificial/acrylic nails, or any other type of covering on natural nails including finger and toenails. If you have artificial nails, gel coating, etc. that needs to be removed by a nail salon please have this removed prior to surgery or surgery may need to be canceled/ delayed if the surgeon/ anesthesia feels like they are unable to be safely monitored.   Do not shave  48 hours prior to surgery.    Do not bring valuables to the hospital. [place].   Contacts, dentures or bridgework may not be worn into surgery.   Bring small overnight bag day of surgery.    Patients discharged on the day of surgery will not be allowed to drive home.  Someone NEEDS to stay with you for the first 24 hours after anesthesia.   Special Instructions: Bring a copy of your healthcare power of attorney and living will documents         the day of surgery if you haven't scanned them before.              Please read over the following fact sheets you were given: IF YOU HAVE QUESTIONS ABOUT YOUR PRE-OP INSTRUCTIONS PLEASE CALL [phone]     [facility] - Preparing for Surgery Before surgery, you can play an important role.  Because skin is not sterile, your skin needs to be as free of germs as possible.  You can reduce the number of germs on  your skin by washing with CHG (chlorahexidine gluconate) soap before surgery.  CHG is an antiseptic cleaner which kills germs and bonds with the skin to continue killing germs even after washing. Please DO NOT use if you have an allergy to CHG or antibacterial soaps.  If your skin becomes reddened/irritated stop using the CHG and inform your nurse when you arrive at Short Stay. Do not shave (including legs and underarms) for at least 48 hours prior to the first CHG shower.  You may shave your face/neck. Please follow these instructions carefully:  1.  Shower with CHG Soap the night before surgery and the  morning of Surgery.  2.  If you choose to wash your hair, wash your hair first as usual with your  normal  shampoo.  3.  After you shampoo, rinse your hair and body thoroughly to remove the  shampoo.                           4.  Use CHG as you would any other liquid soap.  You can apply chg directly  to the skin and wash                       Gently with a scrungie or clean washcloth.  5.  Apply the CHG Soap to your body ONLY FROM THE NECK  DOWN.   Do not use on face/ open                           Wound or open sores. Avoid contact with eyes, ears mouth and genitals (private parts).                       Wash face,  Genitals (private parts) with your normal soap.             6.  Wash thoroughly, paying special attention to the area where your surgery  will be performed.  7.  Thoroughly rinse your body with warm water from the neck down.  8.  DO NOT shower/wash with your normal soap after using and rinsing off  the CHG Soap.                9.  Pat yourself dry with a clean towel.            10.  Wear clean pajamas.            11.  Place clean sheets on your bed the night of your first shower and do not  sleep with pets. Day of Surgery : Do not apply any lotions/deodorants the morning of surgery.  Please wear clean clothes to the hospital/surgery center.  FAILURE TO FOLLOW THESE INSTRUCTIONS MAY  RESULT IN THE CANCELLATION OF YOUR SURGERY PATIENT SIGNATURE_________________________________  NURSE SIGNATURE__________________________________  ________________________________________________________________________   Tara Sutton  An incentive spirometer is a tool that can help keep your lungs clear and active. This tool measures how well you are filling your lungs with each breath. Taking long deep breaths may help reverse or decrease the chance of developing breathing (pulmonary) problems (especially infection) following: A long period of time when you are unable to move or be active. BEFORE THE PROCEDURE  If the spirometer includes an indicator to show your best effort, your nurse or respiratory therapist will set it to a desired goal. If possible, sit up straight or lean slightly forward. Try not to slouch. Hold the incentive spirometer in an upright position. INSTRUCTIONS FOR USE  Sit on the edge of your bed if possible, or sit up as far as you can in bed or on a chair. Hold the incentive spirometer in an upright position. Breathe out normally. Place the mouthpiece in your mouth and seal your lips tightly around it. Breathe in slowly and as deeply as possible, raising the piston or the ball toward the top of the column. Hold your breath for 3-5 seconds or for as long as possible. Allow the piston or ball to fall to the bottom of the column. Remove the mouthpiece from your mouth and breathe out normally. Rest for a few seconds and repeat Steps 1 through 7 at least 10 times every 1-2 hours when you are awake. Take your time and take a few normal breaths between deep breaths. The spirometer may include an indicator to show your best effort. Use the indicator as a goal to work toward during each repetition. After each set of 10 deep breaths, practice coughing to be sure your lungs are clear. If you have an incision (the cut made at the time of surgery), support your incision  when coughing by placing a pillow or rolled up towels firmly against it. Once you are able to get out of bed, walk around indoors and cough well. You may stop  using the incentive spirometer when instructed by your caregiver.  RISKS AND COMPLICATIONS Take your time so you do not get dizzy or light-headed. If you are in pain, you may need to take or ask for pain medication before doing incentive spirometry. It is harder to take a deep breath if you are having pain. AFTER USE Rest and breathe slowly and easily. It can be helpful to keep track of a log of your progress. Your caregiver can provide you with a simple table to help with this. If you are using the spirometer at home, follow these instructions: [place] IF:  You are having difficultly using the spirometer. You have trouble using the spirometer as often as instructed. Your pain medication is not giving enough relief while using the spirometer. You develop fever of 100.5 F (38.1 C) or higher. SEEK IMMEDIATE MEDICAL CARE IF:  You cough up bloody sputum that had not been present before. You develop fever of 102 F (38.9 C) or greater. You develop worsening pain at or near the incision site. MAKE SURE YOU:  Understand these instructions. Will watch your condition. Will get help right away if you are not doing well or get worse. Document Released: 08/28/2006 Document Revised: 07/10/2011 Document Reviewed: 10/29/2006 [facility] Patient Information 2014 [place], [place].   ________________________________________________________________________

## 2021-09-23 ENCOUNTER — Encounter (HOSPITAL_COMMUNITY): Payer: Self-pay | Admitting: *Deleted

## 2021-09-23 ENCOUNTER — Other Ambulatory Visit: Payer: Self-pay

## 2021-09-23 ENCOUNTER — Encounter (HOSPITAL_COMMUNITY)
Admission: RE | Admit: 2021-09-23 | Discharge: 2021-09-23 | Disposition: A | Discharge status: Home / Self Care | Payer: BC Managed Care – PPO | Source: Ambulatory Visit | Attending: Orthopedic Surgery | Admitting: Orthopedic Surgery

## 2021-09-23 VITALS — BP 160/77 | HR 85 | Temp 98.1°F | Ht 64.5 in | Wt 208.0 lb

## 2021-09-23 DIAGNOSIS — M1711 Unilateral primary osteoarthritis, right knee: Secondary | ICD-10-CM | POA: Insufficient documentation

## 2021-09-23 DIAGNOSIS — Z01818 Encounter for other preprocedural examination: Secondary | ICD-10-CM | POA: Insufficient documentation

## 2021-09-23 DIAGNOSIS — Z01812 Encounter for preprocedural laboratory examination: Secondary | ICD-10-CM | POA: Insufficient documentation

## 2021-09-23 DIAGNOSIS — I2510 Atherosclerotic heart disease of native coronary artery without angina pectoris: Secondary | ICD-10-CM | POA: Diagnosis not present

## 2021-09-23 HISTORY — DX: Unspecified osteoarthritis, unspecified site: M19.90

## 2021-09-23 HISTORY — DX: Other specified postprocedural states: Z98.890

## 2021-09-23 HISTORY — DX: Other complications of anesthesia, initial encounter: T88.59XA

## 2021-09-23 LAB — CBC
HCT: 33.3 % — ABNORMAL LOW (ref 36.0–46.0)
Hemoglobin: 11.2 g/dL — ABNORMAL LOW (ref 12.0–15.0)
MCH: 31.5 pg (ref 26.0–34.0)
MCHC: 33.6 g/dL (ref 30.0–36.0)
MCV: 93.5 fL (ref 80.0–100.0)
Platelets: 219 10*3/uL (ref 150–400)
RBC: 3.56 MIL/uL — ABNORMAL LOW (ref 3.87–5.11)
RDW: 13 % (ref 11.5–15.5)
WBC: 5.4 10*3/uL (ref 4.0–10.5)
nRBC: 0 % (ref 0.0–0.2)

## 2021-09-23 LAB — BASIC METABOLIC PANEL
Anion gap: 6 (ref 5–15)
BUN: 24 mg/dL — ABNORMAL HIGH (ref 8–23)
CO2: 31 mmol/L (ref 22–32)
Calcium: 9.9 mg/dL (ref 8.9–10.3)
Chloride: 105 mmol/L (ref 98–111)
Creatinine, Ser: 1.03 mg/dL — ABNORMAL HIGH (ref 0.44–1.00)
GFR, Estimated: 59 mL/min — ABNORMAL LOW (ref >60)
Glucose, Bld: 97 mg/dL (ref 70–99)
Potassium: 3.9 mmol/L (ref 3.5–5.1)
Sodium: 142 mmol/L (ref 135–145)

## 2021-09-23 LAB — TYPE AND SCREEN
ABO/RH(D): O POS
Antibody Screen: NEGATIVE

## 2021-09-23 LAB — SURGICAL PCR SCREEN
MRSA, PCR: NEGATIVE
Staphylococcus aureus: NEGATIVE

## 2021-09-23 NOTE — Progress Notes (Signed)
For Short Stay: [place] appointment date: Date of COVID positive in last 75 days:  Bowel Prep reminder:   For Anesthesia: PCP - Dr. Michael [place] Cardiologist - N/A  Chest x-ray -  EKG -  Stress Test -  ECHO -  Cardiac Cath -  Pacemaker/ICD device last checked: Pacemaker orders received: Device Rep notified:  Spinal Cord Stimulator:  Sleep Study -  CPAP -   Fasting Blood Sugar -  Checks Blood Sugar _____ times a day Date and result of last Hgb A1c-  Blood Thinner Instructions: Aspirin Instructions: Last Dose:  Activity level: Can go up a flight of stairs and activities of daily living without stopping and without chest pain and/or shortness of breath   Able to exercise without chest pain and/or shortness of breath   Unable to go up a flight of stairs without chest pain and/or shortness of breath     Anesthesia review:   Patient denies shortness of breath, fever, cough and chest pain at PAT appointment   Patient verbalized understanding of instructions that were given to them at the PAT appointment. Patient was also instructed that they will need to review over the PAT instructions again at home before surgery.

## 2021-09-30 DIAGNOSIS — Z9889 Other specified postprocedural states: Secondary | ICD-10-CM | POA: Diagnosis not present

## 2021-09-30 DIAGNOSIS — I10 Essential (primary) hypertension: Secondary | ICD-10-CM | POA: Diagnosis not present

## 2021-09-30 DIAGNOSIS — Z299 Encounter for prophylactic measures, unspecified: Secondary | ICD-10-CM | POA: Diagnosis not present

## 2021-10-03 NOTE — H&P (Signed)
TOTAL KNEE ADMISSION H&P  Patient is being admitted for right total knee arthroplasty.  Subjective:  Chief Complaint:right knee pain.  HPI: Tara Sutton, 71 y.o. female, has a history of pain and functional disability in the right knee due to arthritis and has failed non-surgical conservative treatments for greater than 12 weeks to includeNSAID's and/or analgesics, corticosteriod injections, and activity modification.  Onset of symptoms was gradual, starting 2 years ago with gradually worsening course since that time. The patient noted no past surgery on the right knee(s).  Patient currently rates pain in the right knee(s) at 8 out of 10 with activity. Patient has worsening of pain with activity and weight bearing, pain that interferes with activities of daily living, and pain with passive range of motion.  Patient has evidence of joint space narrowing by imaging studies.  There is no active infection.  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   Current Outpatient Medications  Medication Sig Dispense Refill Last Dose   aspirin EC 81 MG tablet Take 81 mg daily by mouth.      benazepril (LOTENSIN) 40 MG tablet Take 40 mg by mouth daily.      Cholecalciferol (VITAMIN D) 50 MCG (2000 UT) tablet Take 2,000 Units by mouth daily.      Cyanocobalamin (B-12) 2500 MCG TABS Take 2,500 mcg by mouth daily.      diclofenac Sodium (VOLTAREN) 1 % GEL Apply 1 application. topically daily as needed (knee/shoulder pain).       docusate sodium (COLACE) 100 MG capsule Take 100 mg by mouth daily.      ELDERBERRY PO Take 50 mg by mouth daily.      hydrochlorothiazide (HYDRODIURIL) 25 MG tablet Take 25 mg daily by mouth.      ibuprofen (ADVIL) 200 MG tablet Take 200 mg by mouth every 6 (six) hours as needed for moderate pain.      Multiple Vitamin (MULTIVITAMIN) capsule Take 1 capsule daily by mouth.      Omega-3 Fatty Acids (FISH OIL) 1200 MG CAPS Take 1,200 mg by mouth daily.      Allergies  Allergen Reactions   Penicillins Swelling    Childhood Reaction     Social History   Tobacco Use   Smoking status: Never   Smokeless tobacco: Never  Substance Use Topics   Alcohol use: No    Family History  Problem Relation Age of Onset   Diabetes Mother    Cancer Mother    Parkinsonism Mother    Diabetes Sister      Review of Systems  Constitutional:  Negative for chills and fever.  Respiratory:  Negative for cough and shortness of breath.   Cardiovascular:  Negative for chest pain.  Gastrointestinal:  Negative for nausea and vomiting.  Musculoskeletal:  Positive for arthralgias.    Objective:  Physical Exam Well nourished and well developed. General: Alert and oriented x3, cooperative and pleasant, no acute distress. Head: normocephalic, atraumatic, neck supple. Eyes:  EOMI.  Musculoskeletal: Right knee exam: Valgus right knee with at least 10 degrees of valgus Slight flexion contracture but not significant Pain over the lateral and anterior aspect the knee Pain with varus correction of her valgus knee Mild lower extremity edema without erythema She has slight flexion contracture but has significant limitation in her flexion to about 80 degrees with terminal tightness and pain   Calves soft and nontender. Motor function intact in LE. Strength 5/5 LE bilaterally. Neuro: Distal pulses 2+. Sensation to light touch intact in LE.  Vital signs in last 24 hours:    Labs:   Estimated body mass index  is 35.15 kg/m as calculated from the following:   Height as of 09/23/21: 5' 4.5" (1.638 m).   Weight as of 09/23/21: 94.3 kg.   Imaging Review Plain radiographs demonstrate severe degenerative joint disease of the right knee(s). The overall alignment isneutral. The bone quality appears to be adequate for age and reported activity level.      Assessment/Plan:  End stage arthritis, right knee   The patient history, physical examination, clinical judgment of the provider and imaging studies are consistent with end stage degenerative joint disease of the right knee(s) and total knee arthroplasty is deemed medically necessary. The treatment options including medical management, injection therapy arthroscopy and arthroplasty were discussed at length. The risks and benefits of total knee arthroplasty were presented and reviewed. The risks due to aseptic loosening, infection, stiffness, patella tracking problems, thromboembolic complications and other imponderables were discussed. The patient acknowledged the explanation, agreed to proceed with the plan and consent was signed. Patient is being admitted for inpatient treatment for surgery, pain control, PT, OT, prophylactic antibiotics, VTE prophylaxis, progressive ambulation and ADL's and discharge planning. The patient is planning to be discharged  home.   Therapy Plans: outpatient therapy at [facility] PT Disposition: Home with husband Planned DVT Prophylaxis: aspirin '81mg'$  BID DME needed: walker PCP: Dr. Woody Seller, TXA: IV Allergies: PCN - childhood, turmeric - angioedema Anesthesia Concerns: nausea BMI: 35.1 Last HgbA1c: Not diabetic   Other: - oxycodone, robaxin, tylenol, celebrex - Patches of atopic dermatitis on he right knee, not in the area of the incision  Patient's anticipated LOS is less than 2 midnights, meeting these requirements: - Younger than 74 - Lives within 1 hour of care - Has a competent adult at home to recover with post-op  recover - NO history of  - Chronic pain requiring opiods  - Diabetes  - Coronary Artery Disease  - Heart failure  - Heart attack  - Stroke  - DVT/VTE  - Cardiac arrhythmia  - Respiratory Failure/COPD  - Renal failure  - Anemia  - Advanced Liver disease  Costella Hatcher, PA-C Orthopedic Surgery [facility] [place] [phone]

## 2021-10-04 ENCOUNTER — Encounter (HOSPITAL_COMMUNITY)
Admission: RE | Disposition: A | Discharge status: Home / Self Care | Payer: Self-pay | Source: Home / Self Care | Attending: Orthopedic Surgery

## 2021-10-04 ENCOUNTER — Ambulatory Visit (HOSPITAL_COMMUNITY): Payer: BC Managed Care – PPO | Admitting: Anesthesiology

## 2021-10-04 ENCOUNTER — Observation Stay (HOSPITAL_COMMUNITY)
Admission: RE | Admit: 2021-10-04 | Discharge: 2021-10-05 | Disposition: A | Discharge status: Home / Self Care | Payer: BC Managed Care – PPO | Attending: Orthopedic Surgery | Admitting: Orthopedic Surgery

## 2021-10-04 ENCOUNTER — Encounter (HOSPITAL_COMMUNITY): Payer: Self-pay | Admitting: Orthopedic Surgery

## 2021-10-04 ENCOUNTER — Other Ambulatory Visit: Payer: Self-pay

## 2021-10-04 DIAGNOSIS — R262 Difficulty in walking, not elsewhere classified: Secondary | ICD-10-CM | POA: Insufficient documentation

## 2021-10-04 DIAGNOSIS — Z7982 Long term (current) use of aspirin: Secondary | ICD-10-CM | POA: Insufficient documentation

## 2021-10-04 DIAGNOSIS — G8918 Other acute postprocedural pain: Secondary | ICD-10-CM | POA: Diagnosis not present

## 2021-10-04 DIAGNOSIS — Z79899 Other long term (current) drug therapy: Secondary | ICD-10-CM | POA: Insufficient documentation

## 2021-10-04 DIAGNOSIS — Z6835 Body mass index (BMI) 35.0-35.9, adult: Secondary | ICD-10-CM | POA: Diagnosis not present

## 2021-10-04 DIAGNOSIS — M25561 Pain in right knee: Secondary | ICD-10-CM | POA: Insufficient documentation

## 2021-10-04 DIAGNOSIS — I10 Essential (primary) hypertension: Secondary | ICD-10-CM | POA: Insufficient documentation

## 2021-10-04 DIAGNOSIS — M1711 Unilateral primary osteoarthritis, right knee: Principal | ICD-10-CM | POA: Insufficient documentation

## 2021-10-04 DIAGNOSIS — Z96651 Presence of right artificial knee joint: Secondary | ICD-10-CM

## 2021-10-04 HISTORY — PX: TOTAL KNEE ARTHROPLASTY: SHX125

## 2021-10-04 LAB — ABO/RH: ABO/RH(D): O POS

## 2021-10-04 SURGERY — ARTHROPLASTY, KNEE, TOTAL
Anesthesia: Monitor Anesthesia Care | Site: Knee | Laterality: Right

## 2021-10-04 MED ORDER — FENTANYL CITRATE (PF) 100 MCG/2ML IJ SOLN
INTRAMUSCULAR | Status: DC | PRN
  Administered 2021-10-04: 50 ug via INTRAVENOUS

## 2021-10-04 MED ORDER — TRANEXAMIC ACID-NACL 1000-0.7 MG/100ML-% IV SOLN
1000.0000 mg | Freq: Once | INTRAVENOUS | Status: AC
  Administered 2021-10-04: 1000 mg via INTRAVENOUS
  Filled 2021-10-04: qty 100

## 2021-10-04 MED ORDER — ONDANSETRON HCL 4 MG PO TABS: 4.0000 mg | ORAL_TABLET | Freq: Four times a day (QID) | ORAL | Status: DC | PRN

## 2021-10-04 MED ORDER — BUPIVACAINE-EPINEPHRINE (PF) 0.25% -1:200000 IJ SOLN
INTRAMUSCULAR | Status: AC
  Filled 2021-10-04: qty 30

## 2021-10-04 MED ORDER — HYDROMORPHONE HCL 1 MG/ML IJ SOLN: 0.5000 mg | INTRAMUSCULAR | Status: DC | PRN

## 2021-10-04 MED ORDER — CLONIDINE HCL (ANALGESIA) 100 MCG/ML EP SOLN
EPIDURAL | Status: DC | PRN
  Administered 2021-10-04: 100 ug

## 2021-10-04 MED ORDER — ROPIVACAINE HCL 7.5 MG/ML IJ SOLN
INTRAMUSCULAR | Status: DC | PRN
  Administered 2021-10-04: 20 mL via PERINEURAL

## 2021-10-04 MED ORDER — CELECOXIB 200 MG PO CAPS
200.0000 mg | ORAL_CAPSULE | Freq: Two times a day (BID) | ORAL | Status: DC
  Administered 2021-10-04: 200 mg via ORAL
  Filled 2021-10-04: qty 1

## 2021-10-04 MED ORDER — CEFAZOLIN SODIUM-DEXTROSE 2-4 GM/100ML-% IV SOLN
2.0000 g | INTRAVENOUS | Status: AC
  Administered 2021-10-04: 2 g via INTRAVENOUS
  Filled 2021-10-04: qty 100

## 2021-10-04 MED ORDER — CEFAZOLIN SODIUM-DEXTROSE 2-4 GM/100ML-% IV SOLN
2.0000 g | Freq: Four times a day (QID) | INTRAVENOUS | Status: AC
  Administered 2021-10-04 (×2): 2 g via INTRAVENOUS
  Filled 2021-10-04 (×2): qty 100

## 2021-10-04 MED ORDER — CHLORHEXIDINE GLUCONATE 0.12 % MT SOLN
15.0000 mL | Freq: Once | OROMUCOSAL | Status: AC
  Administered 2021-10-04: 15 mL via OROMUCOSAL

## 2021-10-04 MED ORDER — FENTANYL CITRATE PF 50 MCG/ML IJ SOSY
PREFILLED_SYRINGE | INTRAMUSCULAR | Status: AC
  Administered 2021-10-04: 50 ug via INTRAVENOUS
  Filled 2021-10-04: qty 2

## 2021-10-04 MED ORDER — POVIDONE-IODINE 10 % EX SWAB
2.0000 "application " | Freq: Once | CUTANEOUS | Status: AC
  Administered 2021-10-04: 2 via TOPICAL

## 2021-10-04 MED ORDER — LACTATED RINGERS IV SOLN: INTRAVENOUS | Status: DC

## 2021-10-04 MED ORDER — MENTHOL 3 MG MT LOZG: 1.0000 | LOZENGE | OROMUCOSAL | Status: DC | PRN

## 2021-10-04 MED ORDER — ONDANSETRON HCL 4 MG/2ML IJ SOLN
INTRAMUSCULAR | Status: AC
  Filled 2021-10-04: qty 2

## 2021-10-04 MED ORDER — DIPHENHYDRAMINE HCL 12.5 MG/5ML PO ELIX: 12.5000 mg | ORAL_SOLUTION | ORAL | Status: DC | PRN

## 2021-10-04 MED ORDER — 0.9 % SODIUM CHLORIDE (POUR BTL) OPTIME
TOPICAL | Status: DC | PRN
  Administered 2021-10-04: 1000 mL

## 2021-10-04 MED ORDER — MIDAZOLAM HCL 2 MG/2ML IJ SOLN: 1.0000 mg | Freq: Once | INTRAMUSCULAR | Status: AC

## 2021-10-04 MED ORDER — DEXAMETHASONE SODIUM PHOSPHATE 10 MG/ML IJ SOLN
8.0000 mg | Freq: Once | INTRAMUSCULAR | Status: AC
  Administered 2021-10-04: 10 mg via INTRAVENOUS

## 2021-10-04 MED ORDER — MIDAZOLAM HCL 2 MG/2ML IJ SOLN
INTRAMUSCULAR | Status: AC
  Filled 2021-10-04: qty 2

## 2021-10-04 MED ORDER — DOCUSATE SODIUM 100 MG PO CAPS
100.0000 mg | ORAL_CAPSULE | Freq: Two times a day (BID) | ORAL | Status: DC
  Administered 2021-10-04 – 2021-10-05 (×2): 100 mg via ORAL
  Filled 2021-10-04 (×2): qty 1

## 2021-10-04 MED ORDER — PHENOL 1.4 % MT LIQD
1.0000 | OROMUCOSAL | Status: DC | PRN
Start: 2021-10-04 — End: 2021-10-05

## 2021-10-04 MED ORDER — ASPIRIN 81 MG PO CHEW
81.0000 mg | CHEWABLE_TABLET | Freq: Two times a day (BID) | ORAL | Status: DC
  Administered 2021-10-04 – 2021-10-05 (×2): 81 mg via ORAL
  Filled 2021-10-04 (×2): qty 1

## 2021-10-04 MED ORDER — ACETAMINOPHEN 10 MG/ML IV SOLN: 1000.0000 mg | Freq: Once | INTRAVENOUS | Status: DC | PRN

## 2021-10-04 MED ORDER — SODIUM CHLORIDE 0.9 % IV SOLN: INTRAVENOUS | Status: DC

## 2021-10-04 MED ORDER — KETOROLAC TROMETHAMINE 30 MG/ML IJ SOLN
INTRAMUSCULAR | Status: AC
Start: 2021-10-04 — End: ?
  Filled 2021-10-04: qty 1

## 2021-10-04 MED ORDER — METHOCARBAMOL 1000 MG/10ML IJ SOLN: 500.0000 mg | Freq: Four times a day (QID) | INTRAVENOUS | Status: DC | PRN

## 2021-10-04 MED ORDER — ONDANSETRON HCL 4 MG/2ML IJ SOLN
4.0000 mg | Freq: Four times a day (QID) | INTRAMUSCULAR | Status: DC | PRN
  Administered 2021-10-04: 4 mg via INTRAVENOUS
  Filled 2021-10-04: qty 2

## 2021-10-04 MED ORDER — ACETAMINOPHEN 325 MG PO TABS: 325.0000 mg | ORAL_TABLET | Freq: Four times a day (QID) | ORAL | Status: DC | PRN

## 2021-10-04 MED ORDER — PROPOFOL 1000 MG/100ML IV EMUL
INTRAVENOUS | Status: AC
  Filled 2021-10-04: qty 100

## 2021-10-04 MED ORDER — FENTANYL CITRATE PF 50 MCG/ML IJ SOSY: 25.0000 ug | PREFILLED_SYRINGE | INTRAMUSCULAR | Status: DC | PRN

## 2021-10-04 MED ORDER — ONDANSETRON HCL 4 MG/2ML IJ SOLN
INTRAMUSCULAR | Status: DC | PRN
  Administered 2021-10-04: 4 mg via INTRAVENOUS

## 2021-10-04 MED ORDER — METOCLOPRAMIDE HCL 5 MG PO TABS: 5.0000 mg | ORAL_TABLET | Freq: Three times a day (TID) | ORAL | Status: DC | PRN

## 2021-10-04 MED ORDER — KETOROLAC TROMETHAMINE 30 MG/ML IJ SOLN
INTRAMUSCULAR | Status: DC | PRN
  Administered 2021-10-04: 30 mg

## 2021-10-04 MED ORDER — EPHEDRINE SULFATE (PRESSORS) 50 MG/ML IJ SOLN
INTRAMUSCULAR | Status: DC | PRN
  Administered 2021-10-04 (×4): 5 mg via INTRAVENOUS

## 2021-10-04 MED ORDER — OXYCODONE HCL 5 MG PO TABS
10.0000 mg | ORAL_TABLET | ORAL | Status: DC | PRN
  Filled 2021-10-04: qty 2

## 2021-10-04 MED ORDER — EPHEDRINE 5 MG/ML INJ
INTRAVENOUS | Status: AC
  Filled 2021-10-04: qty 5

## 2021-10-04 MED ORDER — PROPOFOL 500 MG/50ML IV EMUL
INTRAVENOUS | Status: DC | PRN
  Administered 2021-10-04: 40 ug/kg/min via INTRAVENOUS

## 2021-10-04 MED ORDER — HYDROCHLOROTHIAZIDE 25 MG PO TABS
25.0000 mg | ORAL_TABLET | Freq: Every day | ORAL | Status: DC
  Filled 2021-10-04: qty 1

## 2021-10-04 MED ORDER — LIP MEDEX EX OINT
TOPICAL_OINTMENT | CUTANEOUS | Status: AC
  Filled 2021-10-04: qty 7

## 2021-10-04 MED ORDER — DEXAMETHASONE SODIUM PHOSPHATE 10 MG/ML IJ SOLN
INTRAMUSCULAR | Status: AC
  Filled 2021-10-04: qty 1

## 2021-10-04 MED ORDER — DEXAMETHASONE SODIUM PHOSPHATE 10 MG/ML IJ SOLN
10.0000 mg | Freq: Once | INTRAMUSCULAR | Status: AC
  Administered 2021-10-05: 10 mg via INTRAVENOUS
  Filled 2021-10-04: qty 1

## 2021-10-04 MED ORDER — POLYETHYLENE GLYCOL 3350 17 G PO PACK: 17.0000 g | PACK | Freq: Every day | ORAL | Status: DC | PRN

## 2021-10-04 MED ORDER — FENTANYL CITRATE PF 50 MCG/ML IJ SOSY: 50.0000 ug | PREFILLED_SYRINGE | Freq: Once | INTRAMUSCULAR | Status: AC

## 2021-10-04 MED ORDER — BENAZEPRIL HCL 20 MG PO TABS
40.0000 mg | ORAL_TABLET | Freq: Every day | ORAL | Status: DC
  Filled 2021-10-04: qty 2

## 2021-10-04 MED ORDER — FENTANYL CITRATE (PF) 100 MCG/2ML IJ SOLN
INTRAMUSCULAR | Status: AC
  Filled 2021-10-04: qty 2

## 2021-10-04 MED ORDER — BUPIVACAINE IN DEXTROSE 0.75-8.25 % IT SOLN
INTRATHECAL | Status: DC | PRN
  Administered 2021-10-04: 1.6 mL via INTRATHECAL

## 2021-10-04 MED ORDER — OXYCODONE HCL 5 MG PO TABS
5.0000 mg | ORAL_TABLET | ORAL | Status: DC | PRN
  Administered 2021-10-04: 5 mg via ORAL
  Administered 2021-10-04 – 2021-10-05 (×3): 10 mg via ORAL
  Filled 2021-10-04 (×3): qty 2

## 2021-10-04 MED ORDER — SODIUM CHLORIDE (PF) 0.9 % IJ SOLN
INTRAMUSCULAR | Status: DC | PRN
  Administered 2021-10-04: 30 mL

## 2021-10-04 MED ORDER — ORAL CARE MOUTH RINSE: 15.0000 mL | Freq: Once | OROMUCOSAL | Status: AC

## 2021-10-04 MED ORDER — SODIUM CHLORIDE (PF) 0.9 % IJ SOLN
INTRAMUSCULAR | Status: AC
  Filled 2021-10-04: qty 30

## 2021-10-04 MED ORDER — TRANEXAMIC ACID-NACL 1000-0.7 MG/100ML-% IV SOLN
1000.0000 mg | INTRAVENOUS | Status: AC
  Administered 2021-10-04: 1000 mg via INTRAVENOUS
  Filled 2021-10-04: qty 100

## 2021-10-04 MED ORDER — METOCLOPRAMIDE HCL 5 MG/ML IJ SOLN: 5.0000 mg | Freq: Three times a day (TID) | INTRAMUSCULAR | Status: DC | PRN

## 2021-10-04 MED ORDER — METHOCARBAMOL 500 MG PO TABS
500.0000 mg | ORAL_TABLET | Freq: Four times a day (QID) | ORAL | Status: DC | PRN
  Administered 2021-10-04: 500 mg via ORAL
  Filled 2021-10-04: qty 1

## 2021-10-04 MED ORDER — SODIUM CHLORIDE 0.9 % IR SOLN
Status: DC | PRN
  Administered 2021-10-04: 1000 mL

## 2021-10-04 MED ORDER — BUPIVACAINE-EPINEPHRINE (PF) 0.25% -1:200000 IJ SOLN
INTRAMUSCULAR | Status: DC | PRN
  Administered 2021-10-04: 30 mL

## 2021-10-04 MED ORDER — STERILE WATER FOR IRRIGATION IR SOLN
Status: DC | PRN
  Administered 2021-10-04: 2000 mL

## 2021-10-04 MED ORDER — BISACODYL 10 MG RE SUPP: 10.0000 mg | Freq: Every day | RECTAL | Status: DC | PRN

## 2021-10-04 MED ORDER — MIDAZOLAM HCL 5 MG/5ML IJ SOLN
INTRAMUSCULAR | Status: DC | PRN
  Administered 2021-10-04 (×2): 1 mg via INTRAVENOUS

## 2021-10-04 MED ORDER — FERROUS SULFATE 325 (65 FE) MG PO TABS
325.0000 mg | ORAL_TABLET | Freq: Three times a day (TID) | ORAL | Status: DC
  Administered 2021-10-04 – 2021-10-05 (×3): 325 mg via ORAL
  Filled 2021-10-04 (×3): qty 1

## 2021-10-04 MED ORDER — MIDAZOLAM HCL 2 MG/2ML IJ SOLN
INTRAMUSCULAR | Status: AC
  Administered 2021-10-04: 1 mg via INTRAVENOUS
  Filled 2021-10-04: qty 2

## 2021-10-04 SURGICAL SUPPLY — 57 items
ATTUNE MED ANAT PAT 35 KNEE (Knees) ×1 IMPLANT
ATTUNE PS FEM RT SZ 4 CEM KNEE (Femur) ×1 IMPLANT
ATTUNE PSRP INSR SZ4 6 KNEE (Insert) ×1 IMPLANT
BAG COUNTER SPONGE SURGICOUNT (BAG) ×1 IMPLANT
BAG ZIPLOCK 12X15 (MISCELLANEOUS) ×1 IMPLANT
BASEPLATE TIBIAL ROTATING SZ 4 (Knees) ×1 IMPLANT
BLADE SAW SGTL 11.0X1.19X90.0M (BLADE) ×1 IMPLANT
BLADE SAW SGTL 13.0X1.19X90.0M (BLADE) ×2 IMPLANT
BNDG ELASTIC 6X5.8 VLCR STR LF (GAUZE/BANDAGES/DRESSINGS) ×2 IMPLANT
BOWL SMART MIX CTS (DISPOSABLE) ×2 IMPLANT
CEMENT HV SMART SET (Cement) ×2 IMPLANT
CUFF TOURN SGL QUICK 34 (TOURNIQUET CUFF) ×1
CUFF TRNQT CYL 34X4.125X (TOURNIQUET CUFF) ×1 IMPLANT
DERMABOND ADVANCED (GAUZE/BANDAGES/DRESSINGS) ×1
DERMABOND ADVANCED .7 DNX12 (GAUZE/BANDAGES/DRESSINGS) ×1 IMPLANT
DRAPE SHEET LG 3/4 BI-LAMINATE (DRAPES) ×2 IMPLANT
DRAPE U-SHAPE 47X51 STRL (DRAPES) ×2 IMPLANT
DRESSING AQUACEL AG SP 3.5X10 (GAUZE/BANDAGES/DRESSINGS) ×1 IMPLANT
DRSG AQUACEL AG ADV 3.5X10 (GAUZE/BANDAGES/DRESSINGS) ×1 IMPLANT
DRSG AQUACEL AG SP 3.5X10 (GAUZE/BANDAGES/DRESSINGS) ×2
DURAPREP 26ML APPLICATOR (WOUND CARE) ×4 IMPLANT
ELECT REM PT RETURN 15FT ADLT (MISCELLANEOUS) ×2 IMPLANT
FACESHIELD WRAPAROUND (MASK) ×2 IMPLANT
FACESHIELD WRAPAROUND OR TEAM (MASK) ×1 IMPLANT
GLOVE BIO SURGEON STRL SZ 6 (GLOVE) ×4 IMPLANT
GLOVE BIOGEL M STRL SZ7.5 (GLOVE) ×3 IMPLANT
GLOVE BIOGEL PI IND STRL 6.5 (GLOVE) ×1 IMPLANT
GLOVE BIOGEL PI IND STRL 7.5 (GLOVE) ×1 IMPLANT
GLOVE BIOGEL PI INDICATOR 6.5 (GLOVE) ×3
GLOVE BIOGEL PI INDICATOR 7.5 (GLOVE) ×1
GLOVE ORTHO TXT STRL SZ7.5 (GLOVE) ×5 IMPLANT
GOWN STRL REUS W/ TWL LRG LVL3 (GOWN DISPOSABLE) ×3 IMPLANT
GOWN STRL REUS W/TWL LRG LVL3 (GOWN DISPOSABLE) ×5
HANDPIECE INTERPULSE COAX TIP (DISPOSABLE) ×1
HOLDER FOLEY CATH W/STRAP (MISCELLANEOUS) ×1 IMPLANT
KIT TURNOVER KIT A (KITS) ×1 IMPLANT
MANIFOLD NEPTUNE II (INSTRUMENTS) ×2 IMPLANT
NDL SAFETY ECLIPSE 18X1.5 (NEEDLE) IMPLANT
NEEDLE HYPO 18GX1.5 SHARP (NEEDLE) ×1
NS IRRIG 1000ML POUR BTL (IV SOLUTION) ×2 IMPLANT
PACK TOTAL KNEE CUSTOM (KITS) ×2 IMPLANT
PROTECTOR NERVE ULNAR (MISCELLANEOUS) ×2 IMPLANT
SET HNDPC FAN SPRY TIP SCT (DISPOSABLE) ×1 IMPLANT
SET PAD KNEE POSITIONER (MISCELLANEOUS) ×2 IMPLANT
SPIKE FLUID TRANSFER (MISCELLANEOUS) ×4 IMPLANT
SUT MNCRL AB 4-0 PS2 18 (SUTURE) ×2 IMPLANT
SUT STRATAFIX PDS+ 0 24IN (SUTURE) ×2 IMPLANT
SUT VIC AB 1 CT1 36 (SUTURE) ×2 IMPLANT
SUT VIC AB 2-0 CT1 27 (SUTURE) ×2
SUT VIC AB 2-0 CT1 TAPERPNT 27 (SUTURE) ×2 IMPLANT
SYR 3ML LL SCALE MARK (SYRINGE) ×2 IMPLANT
TOWEL GREEN STERILE FF (TOWEL DISPOSABLE) ×2 IMPLANT
TRAY CATH INTERMITTENT SS 16FR (CATHETERS) ×2 IMPLANT
TRAY FOLEY MTR SLVR 16FR STAT (SET/KITS/TRAYS/PACK) ×2 IMPLANT
TUBE SUCTION HIGH CAP CLEAR NV (SUCTIONS) ×2 IMPLANT
WATER STERILE IRR 1000ML POUR (IV SOLUTION) ×4 IMPLANT
WRAP KNEE MAXI GEL POST OP (GAUZE/BANDAGES/DRESSINGS) ×2 IMPLANT

## 2021-10-04 NOTE — Interval H&P Note (Signed)
History and Physical Interval Note:  10/04/2021 8:37 AM  Tara Sutton  has presented today for surgery, with the diagnosis of Right knee osteoarthritis.  The various methods of treatment have been discussed with the patient and family. After consideration of risks, benefits and other options for treatment, the patient has consented to  Procedure(s): TOTAL KNEE ARTHROPLASTY (Right) as a surgical intervention.  The patient's history has been reviewed, patient examined, no change in status, stable for surgery.  I have reviewed the patient's chart and labs.  Questions were answered to the patient's satisfaction.     [name]

## 2021-10-04 NOTE — Evaluation (Signed)
Physical Therapy Evaluation Patient Details Name: Tara Sutton MRN: [id number] DOB: 04-Jul-1950 Today's Date: 10/04/2021  History of Present Illness  Pt is a 71yo female presenting s/p R-TKA on 10/04/21 PMH: OA, HTN, PONV.  Clinical Impression  Tara Sutton is a 71 y.o. female POD 0 s/p R-TKa. Patient reports independence with mobility at baseline. Patient is now limited by functional impairments (see PT problem list below) and requires min gaurd for transfers and gait with RW. Patient was able to ambulate 40 feet with RW and min guard assist. Patient instructed in exercise to facilitate ROM and circulation to manage edema. Patient will benefit from continued skilled PT interventions to address impairments and progress towards PLOF. Acute PT will follow to progress mobility and stair training in preparation for safe discharge home.       Recommendations for follow up therapy are one component of a multi-disciplinary discharge planning process, led by the attending physician.  Recommendations may be updated based on patient status, additional functional criteria and insurance authorization.  Follow Up Recommendations Follow physician's recommendations for discharge plan and follow up therapies    Assistance Recommended at Discharge Intermittent Supervision/Assistance  Patient can return home with the following  A little help with walking and/or transfers;A little help with bathing/dressing/bathroom;Assistance with cooking/housework;Assist for transportation;Help with stairs or ramp for entrance    Equipment Recommendations Rolling walker (2 wheels)  Recommendations for Other Services       Functional Status Assessment Patient has had a recent decline in their functional status and demonstrates the ability to make significant improvements in function in a reasonable and predictable amount of time.     Precautions / Restrictions Precautions Precautions: Fall Restrictions Weight Bearing  Restrictions: No Other Position/Activity Restrictions: wbat      Mobility  Bed Mobility Overal bed mobility: Needs Assistance Bed Mobility: Supine to Sit     Supine to sit: Min assist     General bed mobility comments: Pt required min assist to bring RLE off bed, otherwise min guard.    Transfers Overall transfer level: Needs assistance Equipment used: Rolling walker (2 wheels) Transfers: Sit to/from Stand Sit to Stand: Min guard, From elevated surface           General transfer comment: Pt min guard from elevated surface, no physical assist required, VCs for sequencing.    Ambulation/Gait Ambulation/Gait assistance: Min guard, +2 safety/equipment Gait Distance (Feet): 40 Feet Assistive device: Rolling walker (2 wheels) Gait Pattern/deviations: Step-to pattern, Decreased dorsiflexion - right Gait velocity: decreased     General Gait Details: Pt ambulated with RW and min guard assist +2 for recliner follow, no physical assist required or overt LOB noted. Pt does walk on the lateral portion of her L foot and this was present PTA.  Stairs            Wheelchair Mobility    Modified Rankin (Stroke Patients Only)       Balance Overall balance assessment: Needs assistance Sitting-balance support: Feet supported, No upper extremity supported Sitting balance-Leahy Scale: Fair     Standing balance support: Bilateral upper extremity supported, During functional activity, Reliant on assistive device for balance Standing balance-Leahy Scale: Poor                               Pertinent Vitals/Pain Pain Assessment Pain Assessment: 0-10 Pain Score: 3  Pain Location: right knee Pain Descriptors / Indicators: Operative site guarding, Discomfort Pain  Intervention(s): Limited activity within patient's tolerance, Monitored during session, Repositioned, Ice applied    Home Living Family/patient expects to be discharged to:: Private residence Living  Arrangements: Spouse/significant other Available Help at Discharge: Family;Available 24 hours/day Type of Home: House Home Access: Level entry (threshold)       Home Layout: One level;Laundry or work area in basement (12 steps on the L going down) Home Equipment: None      Prior Function Prior Level of Function : Independent/Modified Independent;Working/employed;Driving (Work in a fulfillment center)             Mobility Comments: ind ADLs Comments: ind     Journalist, newspaper        Extremity/Trunk Assessment   Upper Extremity Assessment Upper Extremity Assessment: Overall WFL for tasks assessed    Lower Extremity Assessment Lower Extremity Assessment: RLE deficits/detail;LLE deficits/detail RLE Deficits / Details: MMT ank DF/PF 3+/5, no extensor lag noted, reduced ankle DF ROM to less than neutral. RLE Sensation: WNL LLE Deficits / Details: MMT ank DF/PF 4+/5 LLE Sensation: WNL    Cervical / Trunk Assessment Cervical / Trunk Assessment: Normal  Communication   Communication: No difficulties  Cognition Arousal/Alertness: Awake/alert Behavior During Therapy: WFL for tasks assessed/performed Overall Cognitive Status: Within Functional Limits for tasks assessed                                          General Comments General comments (skin integrity, edema, etc.): Husband Yehuda Savannah present for session    Exercises Total Joint Exercises Ankle Circles/Pumps: AROM, Both, 10 reps Other Exercises Other Exercises: incentive spirometry x3, pt was able to acheive 727m.   Assessment/Plan    PT Assessment Patient needs continued PT services  PT Problem List Decreased strength;Decreased range of motion;Decreased activity tolerance;Decreased balance;Decreased mobility;Decreased coordination;Decreased knowledge of use of DME;Pain       PT Treatment Interventions DME instruction;Gait training;Stair training;Functional mobility training;Therapeutic  activities;Therapeutic exercise;Balance training;Neuromuscular re-education;Patient/family education    PT Goals (Current goals can be found in the Care Plan section)  Acute Rehab PT Goals Patient Stated Goal: Bend my knee and go up and down the steps PT Goal Formulation: With patient Time For Goal Achievement: 10/11/21 Potential to Achieve Goals: Good    Frequency 7X/week     Co-evaluation               AM-PAC PT "6 Clicks" Mobility  Outcome Measure Help needed turning from your back to your side while in a flat bed without using bedrails?: None Help needed moving from lying on your back to sitting on the side of a flat bed without using bedrails?: A Little Help needed moving to and from a bed to a chair (including a wheelchair)?: A Little Help needed standing up from a chair using your arms (e.g., wheelchair or bedside chair)?: A Little Help needed to walk in hospital room?: A Little Help needed climbing 3-5 steps with a railing? : A Little 6 Click Score: 19    End of Session Equipment Utilized During Treatment: Gait belt   Patient left: in chair;with call bell/phone within reach;with chair alarm set;with SCD's reapplied;with family/visitor present Nurse Communication: Mobility status PT Visit Diagnosis: Pain;Difficulty in walking, not elsewhere classified (R26.2) Pain - Right/Left: Right Pain - part of body: Knee    Time: [address]-4401PT Time Calculation (min) (ACUTE ONLY): 24 min   Charges:  PT Evaluation $PT Eval Low Complexity: 1 Low         Coolidge Breeze, [place], DPT [facility] Rehabilitation Department Office: [phone] Pager: [phone]  Coolidge Breeze 10/04/2021, 5:22 PM

## 2021-10-04 NOTE — Progress Notes (Signed)
Pt has arrived to 1324 from PACU s/p r TKA.  Husband at bedside. Pt is alert and oriented.  Room orientation completed with call bell placed at bedside.  Will continue to monitor pt.

## 2021-10-04 NOTE — Anesthesia Procedure Notes (Signed)
Anesthesia Regional Block: Adductor canal block   Pre-Anesthetic Checklist: , timeout performed,  Correct Patient, Correct Site, Correct Laterality,  Correct Procedure, Correct Position, site marked,  Risks and benefits discussed,  Surgical consent,  Pre-op evaluation,  At surgeon's request and post-op pain management  Laterality: Right  Prep: Dura Prep       Needles:  Injection technique: Single-shot  Needle Type: Echogenic Stimulator Needle     Needle Length: 10cm  Needle Gauge: 20     Additional Needles:   Procedures:,,,, ultrasound used (permanent image in chart),,    Narrative:  Start time: 10/04/2021 9:10 AM End time: 10/04/2021 9:15 AM Injection made incrementally with aspirations every 5 mL.  Performed by: Personally  Anesthesiologist: Darral Dash, DO  Additional Notes: Patient identified. Risks/Benefits/Options discussed with patient including but not limited to bleeding, infection, nerve damage, failed block, incomplete pain control. Patient expressed understanding and wished to proceed. All questions were answered. Sterile technique was used throughout the entire procedure. Please see nursing notes for vital signs. Aspirated in 5cc intervals with injection for negative confirmation. Patient was given instructions on fall risk and not to get out of bed. All questions and concerns addressed with instructions to call with any issues or inadequate analgesia.

## 2021-10-04 NOTE — Transfer of Care (Signed)
Immediate Anesthesia Transfer of Care Note  Patient: Tara Sutton  Procedure(s) Performed: TOTAL KNEE ARTHROPLASTY (Right: Knee)  Patient Location: PACU  Anesthesia Type:Spinal  Level of Consciousness: awake  Airway & Oxygen Therapy: Patient Spontanous Breathing  Post-op Assessment: Report given to RN  Post vital signs:stable   Last Vitals:  Vitals Value Taken Time  BP 119/60 10/04/21 1145  Temp    Pulse 73 10/04/21 1145  Resp 9 10/04/21 1145  SpO2 97 % 10/04/21 1145  Vitals shown include unvalidated device data.  Last Pain:  Vitals:   10/04/21 0751  TempSrc:   PainSc: 0-No pain         Complications: No notable events documented.

## 2021-10-04 NOTE — Anesthesia Postprocedure Evaluation (Signed)
Anesthesia Post Note  Patient: Tara Sutton  Procedure(s) Performed: TOTAL KNEE ARTHROPLASTY (Right: Knee)     Patient location during evaluation: PACU Anesthesia Type: Regional and MAC Level of consciousness: awake and alert Pain management: pain level controlled Vital Signs Assessment: post-procedure vital signs reviewed and stable Respiratory status: spontaneous breathing, nonlabored ventilation, respiratory function stable and patient connected to nasal cannula oxygen Cardiovascular status: stable and blood pressure returned to baseline Postop Assessment: no apparent nausea or vomiting Anesthetic complications: no   No notable events documented.  Last Vitals:  Vitals:   10/04/21 1454 10/04/21 1519  BP: 125/65 125/65  Pulse: 75 75  Resp: 17 17  Temp: (!) 36.4 C (!) 36.4 C  SpO2: 100%     Last Pain:  Vitals:   10/04/21 1519  TempSrc: Oral  PainSc:                  Tara Sutton

## 2021-10-04 NOTE — Op Note (Signed)
NAME:  Tara Sutton                      MEDICAL RECORD NO.:  [id number]                             FACILITY:  [facility]      PHYSICIAN:  Pietro Cassis. Alvan Dame, M.D.  DATE OF BIRTH:  [birth year]      DATE OF PROCEDURE:  10/04/2021                                     OPERATIVE REPORT         PREOPERATIVE DIAGNOSIS:  Right knee osteoarthritis.      POSTOPERATIVE DIAGNOSIS:  Right knee osteoarthritis.      FINDINGS:  The patient was noted to have complete loss of cartilage and   bone-on-bone arthritis with associated osteophytes in the lateral and patellofemoral compartments of   the knee with a significant synovitis and associated effusion.  The patient had failed months of conservative treatment including medications, injection therapy, activity modification.     PROCEDURE:  Right total knee replacement.      COMPONENTS USED:  DePuy Attune rotating platform posterior stabilized knee   system, a size 4 femur, 4 tibia, size 6 mm PS AOX insert, and 35 anatomic patellar   button.      SURGEON:  Pietro Cassis. Alvan Dame, M.D.      ASSISTANT:  Costella Hatcher, PA-C.      ANESTHESIA:  Regional and Spinal.      SPECIMENS:  None.      COMPLICATION:  None.      DRAINS:  None.  EBL: <100cc      TOURNIQUET TIME:   Total Tourniquet Time Documented: Thigh (Right) - 31 minutes Total: Thigh (Right) - 31 minutes  .      The patient was stable to the recovery room.      INDICATION FOR PROCEDURE:  Tara Sutton is a 71 y.o. female patient of   mine.  The patient had been seen, evaluated, and treated for months conservatively in the   office with medication, activity modification, and injections.  The patient had   radiographic changes of bone-on-bone arthritis with endplate sclerosis and osteophytes noted.  Based on the radiographic changes and failed conservative measures, the patient   decided to proceed with definitive treatment, total knee replacement.  Risks of infection, DVT, component failure,  need for revision surgery, neurovascular injury were reviewed in the office setting.  The postop course was reviewed stressing the efforts to maximize post-operative satisfaction and function.  Consent was obtained for benefit of pain   relief.      PROCEDURE IN DETAIL:  The patient was brought to the operative theater.   Once adequate anesthesia, preoperative antibiotics, 2 gm of Ancef,1 gm of Tranexamic Acid, and 10 mg of Decadron administered, the patient was positioned supine with a right thigh tourniquet placed.  The  right lower extremity was prepped and draped in sterile fashion.  A time-   out was performed identifying the patient, planned procedure, and the appropriate extremity.      The right lower extremity was placed in the [facility] leg holder.  The leg was   exsanguinated, tourniquet elevated to 250 mmHg.  A midline incision was   made followed by  median parapatellar arthrotomy.  Following initial   exposure, attention was first directed to the patella.  Precut   measurement was noted to be 21 mm.  I resected down to 13 mm and used a   35 anatomic patellar button to restore patellar height as well as cover the cut surface.      The lug holes were drilled and a metal shim was placed to protect the   patella from retractors and saw blade during the procedure.      At this point, attention was now directed to the femur.  The femoral   canal was opened with a drill, irrigated to try to prevent fat emboli.  An   intramedullary rod was passed at 5 degrees valgus, 9 mm of bone was   resected off the distal femur.  Following this resection, the tibia was   subluxated anteriorly.  Using the extramedullary guide, 2 mm of bone was resected off   the proximal medial tibia.  We confirmed the gap would be   stable medially and laterally with a size 5 spacer block as well as confirmed that the tibial cut was perpendicular in the coronal plane, checking with an alignment rod.      Once this was  done, I sized the femur to be a size 4 in the anterior-   posterior dimension, chose a standard component based on medial and   lateral dimension.  The size 4 rotation block was then pinned in   position anterior referenced using the C-clamp to set rotation.  The   anterior, posterior, and  chamfer cuts were made without difficulty nor   notching making certain that I was along the anterior cortex to help   with flexion gap stability.      The final box cut was made off the lateral aspect of distal femur.      At this point, the tibia was sized to be a size 4.  The size 4 tray was   then pinned in position through the medial third of the tubercle,   drilled, and keel punched.  Trial reduction was now carried with a 4 femur,  4 tibia, a size 6 mm PS insert, and the 35 anatomic patella botton.  The knee was brought to full extension with good flexion stability with the patella   tracking through the trochlea without application of pressure.  Given   all these findings the trial components removed.  Final components were   opened and cement was mixed.  The knee was irrigated with normal saline solution and pulse lavage.  The synovial lining was   then injected with 30 cc of 0.25% Marcaine with epinephrine, 1 cc of Toradol and 30 cc of NS for a total of 61 cc.     Final implants were then cemented onto cleaned and dried cut surfaces of bone with the knee brought to extension with a size 6 mm PS trial insert.      Once the cement had fully cured, excess cement was removed   throughout the knee.  I confirmed that I was satisfied with the range of   motion and stability, and the final size 6 mm PS AOX insert was chosen.  It was   placed into the knee.      The tourniquet had been let down at 31 minutes.  No significant   hemostasis was required.  The extensor mechanism was then reapproximated using #1 Vicryl and #1 Stratafix  sutures with the knee   in flexion.  The   remaining wound was closed  with 2-0 Vicryl and running 4-0 Monocryl.   The knee was cleaned, dried, dressed sterilely using Dermabond and   Aquacel dressing.  The patient was then   brought to recovery room in stable condition, tolerating the procedure   well.   Please note that Physician Assistant, Costella Hatcher, PA-C was present for the entirety of the case, and was utilized for pre-operative positioning, peri-operative retractor management, general facilitation of the procedure and for primary wound closure at the end of the case.              Pietro Cassis Alvan Dame, M.D.    10/04/2021 11:22 AM

## 2021-10-04 NOTE — Anesthesia Procedure Notes (Signed)
Spinal  Patient location during procedure: OR Start time: 10/04/2021 10:00 AM End time: 10/04/2021 10:06 AM Staffing Performed: anesthesiologist  Anesthesiologist: Darral Dash, DO Preanesthetic Checklist Completed: patient identified, IV checked, site marked, risks and benefits discussed, surgical consent, monitors and equipment checked, pre-op evaluation and timeout performed Spinal Block Patient position: sitting Prep: DuraPrep Patient monitoring: heart rate, cardiac monitor, continuous pulse ox and blood pressure Approach: midline Location: L3-4 Injection technique: single-shot Needle Needle type: Quincke  Needle gauge: 22 G Needle length: 10 cm Assessment Events: CSF return Additional Notes Patient identified. Risks/Benefits/Options discussed with patient including but not limited to bleeding, infection, nerve damage, paralysis, failed block, incomplete pain control, headache, blood pressure changes, nausea, vomiting, reactions to medications, itching and postpartum back pain. Confirmed with bedside nurse the patient's most recent platelet count. Confirmed with patient that they are not currently taking any anticoagulation, have any bleeding history or any family history of bleeding disorders. Patient expressed understanding and wished to proceed. All questions were answered. Sterile technique was used throughout the entire procedure. Please see nursing notes for vital signs. Warning signs of high block given to the patient including shortness of breath, tingling/numbness in hands, complete motor block, or any concerning symptoms with instructions to call for help. Patient was given instructions on fall risk and not to get out of bed. All questions and concerns addressed with instructions to call with any issues or inadequate analgesia.

## 2021-10-04 NOTE — Plan of Care (Signed)

## 2021-10-04 NOTE — Anesthesia Preprocedure Evaluation (Signed)
Anesthesia Evaluation  Patient identified by MRN, date of birth, ID band Patient awake    Reviewed: Allergy & Precautions, NPO status , Patient's Chart, lab work & pertinent test results  History of Anesthesia Complications (+) PONV and history of anesthetic complications  Airway Mallampati: II  TM Distance: >3 FB Neck ROM: Full    Dental no notable dental hx.    Pulmonary neg pulmonary ROS,    Pulmonary exam normal        Cardiovascular hypertension, Pt. on medications  Rhythm:Regular Rate:Normal     Neuro/Psych negative neurological ROS  negative psych ROS   GI/Hepatic negative GI ROS, Neg liver ROS,   Endo/Other  negative endocrine ROS  Renal/GU negative Renal ROS  negative genitourinary   Musculoskeletal  (+) Arthritis , Osteoarthritis,    Abdominal Normal abdominal exam  (+)   Peds  Hematology negative hematology ROS (+)   Anesthesia Other Findings   Reproductive/Obstetrics                             Anesthesia Physical Anesthesia Plan  ASA: 2  Anesthesia Plan: MAC, Spinal and Regional   Post-op Pain Management: Regional block*   Induction: Intravenous  PONV Risk Score and Plan: 3 and Ondansetron, Dexamethasone, Propofol infusion, Midazolam and Treatment may vary due to age or medical condition  Airway Management Planned: Simple Face Mask, Natural Airway and Nasal Cannula  Additional Equipment: None  Intra-op Plan:   Post-operative Plan:   Informed Consent: I have reviewed the patients History and Physical, chart, labs and discussed the procedure including the risks, benefits and alternatives for the proposed anesthesia with the patient or authorized representative who has indicated his/her understanding and acceptance.     Dental advisory given  Plan Discussed with: CRNA  Anesthesia Plan Comments: (Lab Results      Component                Value                Date                      WBC                      5.4                 09/23/2021                HGB                      11.2 (L)            09/23/2021                HCT                      33.3 (L)            09/23/2021                MCV                      93.5                09/23/2021                PLT  219                 09/23/2021           Lab Results      Component                Value               Date                      NA                       142                 09/23/2021                K                        3.9                 09/23/2021                CO2                      31                  09/23/2021                GLUCOSE                  97                  09/23/2021                BUN                      24 (H)              09/23/2021                CREATININE               1.03 (H)            09/23/2021                CALCIUM                  9.9                 09/23/2021                GFRNONAA                 59 (L)              09/23/2021          )        Anesthesia Quick Evaluation

## 2021-10-04 NOTE — Discharge Instructions (Signed)

## 2021-10-05 DIAGNOSIS — Z6835 Body mass index (BMI) 35.0-35.9, adult: Secondary | ICD-10-CM | POA: Diagnosis not present

## 2021-10-05 DIAGNOSIS — M1711 Unilateral primary osteoarthritis, right knee: Secondary | ICD-10-CM | POA: Diagnosis not present

## 2021-10-05 DIAGNOSIS — I10 Essential (primary) hypertension: Secondary | ICD-10-CM | POA: Diagnosis not present

## 2021-10-05 DIAGNOSIS — Z79899 Other long term (current) drug therapy: Secondary | ICD-10-CM | POA: Diagnosis not present

## 2021-10-05 DIAGNOSIS — Z7982 Long term (current) use of aspirin: Secondary | ICD-10-CM | POA: Diagnosis not present

## 2021-10-05 DIAGNOSIS — M25561 Pain in right knee: Secondary | ICD-10-CM | POA: Diagnosis not present

## 2021-10-05 LAB — CBC
HCT: 27.5 % — ABNORMAL LOW (ref 36.0–46.0)
Hemoglobin: 9.1 g/dL — ABNORMAL LOW (ref 12.0–15.0)
MCH: 31 pg (ref 26.0–34.0)
MCHC: 33.1 g/dL (ref 30.0–36.0)
MCV: 93.5 fL (ref 80.0–100.0)
Platelets: 184 10*3/uL (ref 150–400)
RBC: 2.94 MIL/uL — ABNORMAL LOW (ref 3.87–5.11)
RDW: 12.8 % (ref 11.5–15.5)
WBC: 10.7 10*3/uL — ABNORMAL HIGH (ref 4.0–10.5)
nRBC: 0 % (ref 0.0–0.2)

## 2021-10-05 LAB — BASIC METABOLIC PANEL
Anion gap: 8 (ref 5–15)
BUN: 34 mg/dL — ABNORMAL HIGH (ref 8–23)
CO2: 26 mmol/L (ref 22–32)
Calcium: 8.8 mg/dL — ABNORMAL LOW (ref 8.9–10.3)
Chloride: 106 mmol/L (ref 98–111)
Creatinine, Ser: 1.45 mg/dL — ABNORMAL HIGH (ref 0.44–1.00)
GFR, Estimated: 39 mL/min — ABNORMAL LOW (ref >60)
Glucose, Bld: 166 mg/dL — ABNORMAL HIGH (ref 70–99)
Potassium: 4.3 mmol/L (ref 3.5–5.1)
Sodium: 140 mmol/L (ref 135–145)

## 2021-10-05 MED ORDER — OXYCODONE HCL 5 MG PO TABS: 5.0000 mg | ORAL_TABLET | ORAL | 0 refills | Status: AC | PRN

## 2021-10-05 MED ORDER — ACETAMINOPHEN 325 MG PO TABS: 1000.0000 mg | ORAL_TABLET | Freq: Four times a day (QID) | ORAL | Status: AC | PRN

## 2021-10-05 MED ORDER — SODIUM CHLORIDE 0.9 % IV BOLUS
250.0000 mL | Freq: Once | INTRAVENOUS | Status: AC
  Administered 2021-10-05: 250 mL via INTRAVENOUS

## 2021-10-05 MED ORDER — METHOCARBAMOL 500 MG PO TABS: 500.0000 mg | ORAL_TABLET | Freq: Four times a day (QID) | ORAL | 0 refills | Status: AC | PRN

## 2021-10-05 MED ORDER — ASPIRIN 81 MG PO CHEW: 81.0000 mg | CHEWABLE_TABLET | Freq: Two times a day (BID) | ORAL | 0 refills | Status: AC

## 2021-10-05 MED ORDER — POLYETHYLENE GLYCOL 3350 17 G PO PACK: 17.0000 g | PACK | Freq: Every day | ORAL | 0 refills | Status: AC | PRN

## 2021-10-05 NOTE — Progress Notes (Signed)
   Subjective: 1 Day Post-Op Procedure(s) (LRB): TOTAL KNEE ARTHROPLASTY (Right) Patient reports pain as mild.   Patient seen in rounds with Dr. Alvan Dame. Patient is well, and has had no acute complaints or problems. No acute events overnight. Foley catheter removed. Patient ambulated 40 feet with PT.  We will continue therapy today.   Objective: Vital signs in last 24 hours: Temp:  [97.5 F (36.4 C)-97.9 F (36.6 C)] 97.8 F (36.6 C) (06/07 0535) Pulse Rate:  [61-81] 63 (06/07 0535) Resp:  [11-18] 18 (06/07 0535) BP: (101-127)/(53-77) 102/56 (06/07 0535) SpO2:  [91 %-100 %] 98 % (06/07 0535) Weight:  [94.3 kg] 94.3 kg (06/06 1519)  Intake/Output from previous day:  Intake/Output Summary (Last 24 hours) at 10/05/2021 0745 Last data filed at 10/05/2021 0535 Gross per 24 hour  Intake 2869.29 ml  Output 1050 ml  Net 1819.29 ml     Intake/Output this shift: No intake/output data recorded.  Labs: Recent Labs    10/05/21 0303  HGB 9.1*   Recent Labs    10/05/21 0303  WBC 10.7*  RBC 2.94*  HCT 27.5*  PLT 184   Recent Labs    10/05/21 0303  NA 140  K 4.3  CL 106  CO2 26  BUN 34*  CREATININE 1.45*  GLUCOSE 166*  CALCIUM 8.8*   No results for input(s): LABPT, INR in the last 72 hours.  Exam: General - Patient is Alert and Oriented Extremity - Neurologically intact Sensation intact distally Intact pulses distally Dorsiflexion/Plantar flexion intact Dressing - dressing C/D/I Motor Function - intact, moving foot and toes well on exam.   Past Medical History:  Diagnosis Date   Arthritis    Complication of anesthesia    Fatigue    Hypertension    PONV (postoperative nausea and vomiting)     Assessment/Plan: 1 Day Post-Op Procedure(s) (LRB): TOTAL KNEE ARTHROPLASTY (Right) Principal Problem:   S/P total knee arthroplasty, right  Estimated body mass index is 35.15 kg/m as calculated from the following:   Height as of this encounter: 5' 4.5" (1.638 m).    Weight as of this encounter: 94.3 kg. Advance diet Up with therapy D/C IV fluids   Patient's anticipated LOS is less than 2 midnights, meeting these requirements: - Younger than 20 - Lives within 1 hour of care - Has a competent adult at home to recover with post-op recover - NO history of  - Chronic pain requiring opiods  - Diabetes  - Coronary Artery Disease  - Heart failure  - Heart attack  - Stroke  - DVT/VTE  - Cardiac arrhythmia  - Respiratory Failure/COPD  - Renal failure  - Anemia  - Advanced Liver disease     DVT Prophylaxis - Aspirin Weight bearing as tolerated.  Hgb stable at 9.1 this AM. Cr. Elevated from 1.03 to 1.45 - will avoid NSAIDs  Plan is to go Home after hospital stay. Plan for discharge today following 1-2 sessions of PT as long as they are meeting their goals. Patient is scheduled for OPPT. Follow up in the office in 2 weeks.   Griffith Citron, PA-C Orthopedic Surgery [phone] 10/05/2021, 7:45 AM

## 2021-10-05 NOTE — Progress Notes (Signed)
Reviewed discharge instructions with patient. Pt verbalized understanding.

## 2021-10-05 NOTE — Plan of Care (Signed)
  Problem: Activity: Goal: Ability to avoid complications of mobility impairment will improve Outcome: Progressing Goal: Range of joint motion will improve Outcome: Progressing   Problem: Pain Management: Goal: Pain level will decrease with appropriate interventions Outcome: Progressing   Problem: Clinical Measurements: Goal: Postoperative complications will be avoided or minimized Outcome: Progressing   Problem: Education: Goal: Knowledge of General Education information will improve Description: Including pain rating scale, medication(s)/side effects and non-pharmacologic comfort measures Outcome: Progressing   Problem: Clinical Measurements: Goal: Respiratory complications will improve Outcome: Progressing   Problem: Nutrition: Goal: Adequate nutrition will be maintained Outcome: Progressing   Problem: Coping: Goal: Level of anxiety will decrease Outcome: Progressing   Problem: Pain Managment: Goal: General experience of comfort will improve Outcome: Progressing   Problem: Safety: Goal: Ability to remain free from injury will improve Outcome: Progressing

## 2021-10-05 NOTE — Progress Notes (Addendum)
Physical Therapy Treatment Patient Details Name: Tara Sutton MRN: [id number] DOB: 04/13/1951 Today's Date: 10/05/2021   History of Present Illness Pt is a 71yo female presenting s/p R-TKA on 10/04/21 PMH: OA, HTN, PONV.    PT Comments    POD # 1 am session after receiving IV Bolus General Comments: AxO x 1 very pleasant and motivated. Assisted OOB to amb in hallway went well. General bed mobility comments: demonstarted and instructed how to use belt to self asisst LE OOB.  General transfer comment: 25% VC's on proper hand placement and safety with turns. General Gait Details: slight c/o nausea and "a little dizzy".  BP 128/78.  Pt required 25% VC's on proper sequencing as well as proper walker to self distance. Then returned to room to perform some TE's following HEP handout.  Instructed on proper tech, freq as well as use of ICE.   Pt will need another PT session to complete HEP and increase her amb distance/tolerance.    Recommendations for follow up therapy are one component of a multi-disciplinary discharge planning process, led by the attending physician.  Recommendations may be updated based on patient status, additional functional criteria and insurance authorization.  Follow Up Recommendations  Follow physician's recommendations for discharge plan and follow up therapies     Assistance Recommended at Discharge Intermittent Supervision/Assistance  Patient can return home with the following A little help with walking and/or transfers;A little help with bathing/dressing/bathroom;Assistance with cooking/housework;Assist for transportation;Help with stairs or ramp for entrance   Equipment Recommendations  Rolling walker (2 wheels)    Recommendations for Other Services       Precautions / Restrictions Precautions Precautions: Fall Restrictions Weight Bearing Restrictions: No Other Position/Activity Restrictions: WBAT     Mobility  Bed Mobility Overal bed mobility: Needs  Assistance Bed Mobility: Supine to Sit     Supine to sit: Supervision, Min guard     General bed mobility comments: demonstarted and instructed how to use belt to self asisst LE OOB    Transfers Overall transfer level: Needs assistance Equipment used: Rolling walker (2 wheels) Transfers: Sit to/from Stand Sit to Stand: Supervision, Min guard           General transfer comment: 25% VC's on proper hand placement and safety with turns.    Ambulation/Gait Ambulation/Gait assistance: Supervision, Min guard Gait Distance (Feet): 45 Feet Assistive device: Rolling walker (2 wheels) Gait Pattern/deviations: Step-to pattern, Decreased dorsiflexion - right Gait velocity: decreased     General Gait Details: 25% VC's on proper sequencing as well as proper walker to self distance.   Stairs             Wheelchair Mobility    Modified Rankin (Stroke Patients Only)       Balance                                            Cognition                                       General Comments: AxO x 1 very pleasant and motivated.        Exercises  Total Knee Replacement TE's following HEP handout 10 reps B LE ankle pumps 05 reps towel squeezes 05 reps knee presses 05 reps heel slides  05 reps SAQ's 05 reps SLR's 05 reps ABD Educated on use of gait belt to assist with TE's Followed by ICE     General Comments        Pertinent Vitals/Pain Pain Assessment Pain Assessment: 0-10 Pain Score: 5  Pain Location: right knee Pain Descriptors / Indicators: Operative site guarding, Discomfort    Home Living                          Prior Function            PT Goals (current goals can now be found in the care plan section) Progress towards PT goals: Progressing toward goals    Frequency    7X/week      PT Plan Current plan remains appropriate    Co-evaluation              AM-PAC PT "6 Clicks"  Mobility   Outcome Measure  Help needed turning from your back to your side while in a flat bed without using bedrails?: A Little Help needed moving from lying on your back to sitting on the side of a flat bed without using bedrails?: A Little Help needed moving to and from a bed to a chair (including a wheelchair)?: A Little Help needed standing up from a chair using your arms (e.g., wheelchair or bedside chair)?: A Little Help needed to walk in hospital room?: A Little Help needed climbing 3-5 steps with a railing? : A Little 6 Click Score: 18    End of Session Equipment Utilized During Treatment: Gait belt Activity Tolerance: Patient tolerated treatment well Patient left: in chair;with call bell/phone within reach;with chair alarm set;with SCD's reapplied;with family/visitor present Nurse Communication: Mobility status PT Visit Diagnosis: Pain;Difficulty in walking, not elsewhere classified (R26.2) Pain - Right/Left: Right Pain - part of body: Knee     Time: 1134-1208 PT Time Calculation (min) (ACUTE ONLY): 34 min  Charges:  $Gait Training: 8-22 mins $Therapeutic Exercise: 8-22 mins                     Rica Koyanagi  PTA Acute  Rehabilitation Services Pager      [phone] Office      [phone]

## 2021-10-05 NOTE — TOC Transition Note (Signed)
Transition of Care [facility]) - CM/SW Discharge Note   Patient Details  Name: Tara Sutton MRN: [id number] Date of Birth: 1950-12-29  Transition of Care [facility]) CM/SW Contact:  Lennart Pall, LCSW Phone Number: 10/05/2021, 10:35 AM   Clinical Narrative:     Met with pt and confirming receipt of RW via [place].  OPPT arranged with Layne Benton PT.  No TOC needs.  Final next level of care: OP Rehab Barriers to Discharge: No Barriers Identified   Patient Goals and CMS Choice Patient states their goals for this hospitalization and ongoing recovery are:: return home      Discharge Placement                       Discharge Plan and Services                DME Arranged: Walker rolling DME Agency: [place]                  Social Determinants of Health (SDOH) Interventions     Readmission Risk Interventions     View : No data to display.

## 2021-10-05 NOTE — Plan of Care (Signed)
  Problem: Activity: Goal: Ability to avoid complications of mobility impairment will improve Outcome: Progressing Goal: Range of joint motion will improve Outcome: Progressing   Problem: Pain Management: Goal: Pain level will decrease with appropriate interventions Outcome: Progressing   

## 2021-10-05 NOTE — Progress Notes (Signed)
Physical Therapy Treatment Patient Details Name: Tara Sutton MRN: [id number] DOB: [birth year] Today's Date: 10/05/2021   History of Present Illness Pt is a 71yo female presenting s/p R-TKA on 10/04/21 PMH: OA, HTN, PONV.    PT Comments    POD # 1 pm session Pt feeling even better.  Assisted in bathroom, static standing at sink for oral care.  Pt self able.  Assisted with amb an increased distance in hallway.  General Gait Details: tolerated an increased distance with NO c/o dizziness.  "I feel better this afternoon".  General stair comments: Pt requested to practice stairs.  Instructed on proper tech using B hands on ONE rail as well as correct sequencing.  Then returned to room to perform some TE's following HEP handout.  Instructed on proper tech, freq as well as use of ICE.   Demonstrated and instructed on proper tech to get into HIGH bed using one step stool "backward" approach.  Addressed all mobility questions, discussed appropriate activity, educated on use of ICE.  Pt ready for D/C to home.   Recommendations for follow up therapy are one component of a multi-disciplinary discharge planning process, led by the attending physician.  Recommendations may be updated based on patient status, additional functional criteria and insurance authorization.  Follow Up Recommendations  Follow physician's recommendations for discharge plan and follow up therapies     Assistance Recommended at Discharge Intermittent Supervision/Assistance  Patient can return home with the following A little help with walking and/or transfers;A little help with bathing/dressing/bathroom;Assistance with cooking/housework;Assist for transportation;Help with stairs or ramp for entrance   Equipment Recommendations  Rolling walker (2 wheels)    Recommendations for Other Services       Precautions / Restrictions Precautions Precautions: Fall Restrictions Weight Bearing Restrictions: No Other Position/Activity  Restrictions: WBAT     Mobility  Bed Mobility Overal bed mobility: Needs Assistance Bed Mobility: Supine to Sit     Supine to sit: Supervision, Min guard     General bed mobility comments: demonstarted and instructed how to use belt to self asisst LE OOB    Transfers Overall transfer level: Needs assistance Equipment used: Rolling walker (2 wheels) Transfers: Sit to/from Stand Sit to Stand: Supervision, Min guard           General transfer comment: also assisted to bathroom.  Pt self able including self peri care.    Ambulation/Gait Ambulation/Gait assistance: Supervision Gait Distance (Feet): 75 Feet Assistive device: Rolling walker (2 wheels) Gait Pattern/deviations: Step-to pattern, Decreased dorsiflexion - right Gait velocity: decreased     General Gait Details: tolerated an increased distance with NO c/o dizziness.  "I feel better this afternoon".   Stairs Stairs: Yes Stairs assistance: Supervision, Min guard Stair Management: One rail Right, Step to pattern, Forwards Number of Stairs: 4 General stair comments: Pt requested to practice stairs.  Instructed on proper tech using B hands on ONE rail as well as correct sequencing.   Wheelchair Mobility    Modified Rankin (Stroke Patients Only)       Balance                                            Cognition  General Comments: AxO x 1 very pleasant and motivated.        Exercises  05 reps all seated TE's following HEP handout    General Comments        Pertinent Vitals/Pain Pain Assessment Pain Assessment: 0-10 Pain Score: 5  Pain Location: right knee Pain Descriptors / Indicators: Operative site guarding, Discomfort    Home Living                          Prior Function            PT Goals (current goals can now be found in the care plan section) Progress towards PT goals: Progressing toward  goals    Frequency    7X/week      PT Plan Current plan remains appropriate    Co-evaluation              AM-PAC PT "6 Clicks" Mobility   Outcome Measure  Help needed turning from your back to your side while in a flat bed without using bedrails?: A Little Help needed moving from lying on your back to sitting on the side of a flat bed without using bedrails?: A Little Help needed moving to and from a bed to a chair (including a wheelchair)?: A Little Help needed standing up from a chair using your arms (e.g., wheelchair or bedside chair)?: A Little Help needed to walk in hospital room?: A Little Help needed climbing 3-5 steps with a railing? : A Little 6 Click Score: 18    End of Session Equipment Utilized During Treatment: Gait belt Activity Tolerance: Patient tolerated treatment well Patient left: in chair;with call bell/phone within reach;with chair alarm set;with SCD's reapplied;with family/visitor present Nurse Communication: Mobility status PT Visit Diagnosis: Pain;Difficulty in walking, not elsewhere classified (R26.2) Pain - Right/Left: Right Pain - part of body: Knee     Time: 1330-1410 PT Time Calculation (min) (ACUTE ONLY): 40 min  Charges:  $Gait Training: 8-22 mins $Therapeutic Exercise: 8-22 mins $Therapeutic Activity: 8-22 mins                     {Andreana Klingerman  PTA Acute  Rehabilitation Services Pager      [phone] Office      [phone]

## 2021-10-06 ENCOUNTER — Encounter (HOSPITAL_COMMUNITY): Payer: Self-pay | Admitting: Orthopedic Surgery

## 2021-10-20 NOTE — Discharge Summary (Cosign Needed)
Patient ID: Tara Sutton MRN: [id number] DOB/AGE: [birth year] 71 y.o.  Admit date: 10/04/2021 Discharge date: 10/05/2021  Admission Diagnoses:  Right knee osteoarthritis  Discharge Diagnoses:  Principal Problem:   S/P total knee arthroplasty, right   Past Medical History:  Diagnosis Date   Arthritis    Complication of anesthesia    Fatigue    Hypertension    PONV (postoperative nausea and vomiting)     Surgeries: Procedure(s): TOTAL KNEE ARTHROPLASTY on 10/04/2021   Consultants:   Discharged Condition: Improved  Hospital Course: Tara Sutton is an 71 y.o. female who was admitted 10/04/2021 for operative treatment ofS/P total knee arthroplasty, right. Patient has severe unremitting pain that affects sleep, daily activities, and work/hobbies. After pre-op clearance the patient was taken to the operating room on 10/04/2021 and underwent  Procedure(s): TOTAL KNEE ARTHROPLASTY.    Patient was given perioperative antibiotics:  Anti-infectives (From admission, onward)    Start     Dose/Rate Route Frequency Ordered Stop   10/04/21 1600  ceFAZolin (ANCEF) IVPB 2g/100 mL premix        2 g 200 mL/hr over 30 Minutes Intravenous Every 6 hours 10/04/21 1501 10/04/21 2220   10/04/21 0745  ceFAZolin (ANCEF) IVPB 2g/100 mL premix        2 g 200 mL/hr over 30 Minutes Intravenous On call to O.R. 10/04/21 0737 10/04/21 1003        Patient was given sequential compression devices, early ambulation, and chemoprophylaxis to prevent DVT. Patient worked with PT and was meeting their goals regarding safe ambulation and transfers.  Patient benefited maximally from hospital stay and there were no complications.    Recent vital signs: No data found.   Recent laboratory studies: No results for input(s): "WBC", "HGB", "HCT", "PLT", "NA", "K", "CL", "CO2", "BUN", "CREATININE", "GLUCOSE", "INR", "CALCIUM" in the last 72 hours.  Invalid input(s): "PT", "2"   Discharge Medications:   Allergies as  of 10/05/2021       Reactions   Penicillins Swelling   Childhood Reaction - swelling of arm Tolerated Cephalosporin 10/04/21   Qc Tumeric Complex [turmeric] Swelling   Lip swelling        Medication List     STOP taking these medications    aspirin EC 81 MG tablet Replaced by: aspirin 81 MG chewable tablet   diclofenac Sodium 1 % Gel Commonly known as: VOLTAREN   Fish Oil 1200 MG Caps   ibuprofen 200 MG tablet Commonly known as: ADVIL       TAKE these medications    acetaminophen 325 MG tablet Commonly known as: TYLENOL Take 3 tablets (975 mg total) by mouth every 6 (six) hours as needed for mild pain (pain score 1-3 or temp > 100.5).   aspirin 81 MG chewable tablet Chew 1 tablet (81 mg total) by mouth 2 (two) times daily for 28 days. Replaces: aspirin EC 81 MG tablet   B-12 2500 MCG Tabs Take 2,500 mcg by mouth daily.   benazepril 40 MG tablet Commonly known as: LOTENSIN Take 40 mg by mouth daily.   docusate sodium 100 MG capsule Commonly known as: COLACE Take 100 mg by mouth daily.   ELDERBERRY PO Take 50 mg by mouth daily.   hydrochlorothiazide 25 MG tablet Commonly known as: HYDRODIURIL Take 25 mg daily by mouth.   methocarbamol 500 MG tablet Commonly known as: ROBAXIN Take 1 tablet (500 mg total) by mouth every 6 (six) hours as needed for muscle spasms.   multivitamin  capsule Take 1 capsule daily by mouth.   oxyCODONE 5 MG immediate release tablet Commonly known as: Oxy IR/ROXICODONE Take 1-2 tablets (5-10 mg total) by mouth every 4 (four) hours as needed for moderate pain or severe pain. Start with 1 tablet every 4 hours as needed. Take 2 only for severe pain.   polyethylene glycol 17 g packet Commonly known as: MIRALAX / GLYCOLAX Take 17 g by mouth daily as needed for mild constipation.   Vitamin D 50 MCG (2000 UT) tablet Take 2,000 Units by mouth daily.               Discharge Care Instructions  (From admission, onward)            Start     Ordered   10/05/21 0000  Change dressing       Comments: Maintain surgical dressing until follow up in the clinic. If the edges start to pull up, may reinforce with tape. If the dressing is no longer working, may remove and cover with gauze and tape, but must keep the area dry and clean.  Call with any questions or concerns.   10/05/21 0749            Diagnostic Studies: No results found.  Disposition: Discharge disposition: 01-Home or Self Care       Discharge Instructions     Call MD / Call 911   Complete by: As directed    If you experience chest pain or shortness of breath, CALL 911 and be transported to the hospital emergency room.  If you develope a fever above 101 F, pus (white drainage) or increased drainage or redness at the wound, or calf pain, call your surgeon's office.   Change dressing   Complete by: As directed    Maintain surgical dressing until follow up in the clinic. If the edges start to pull up, may reinforce with tape. If the dressing is no longer working, may remove and cover with gauze and tape, but must keep the area dry and clean.  Call with any questions or concerns.   Constipation Prevention   Complete by: As directed    Drink plenty of fluids.  Prune juice may be helpful.  You may use a stool softener, such as Colace (over the counter) 100 mg twice a day.  Use MiraLax (over the counter) for constipation as needed.   Diet - low sodium heart healthy   Complete by: As directed    Increase activity slowly as tolerated   Complete by: As directed    Weight bearing as tolerated with assist device (walker, cane, etc) as directed, use it as long as suggested by your surgeon or therapist, typically at least 4-6 weeks.   Post-operative opioid taper instructions:   Complete by: As directed    POST-OPERATIVE OPIOID TAPER INSTRUCTIONS: It is important to wean off of your opioid medication as soon as possible. If you do not need pain  medication after your surgery it is ok to stop day one. Opioids include: Codeine, Hydrocodone(Norco, Vicodin), Oxycodone(Percocet, oxycontin) and hydromorphone amongst others.  Long term and even short term use of opiods can cause: Increased pain response Dependence Constipation Depression Respiratory depression And more.  Withdrawal symptoms can include Flu like symptoms Nausea, vomiting And more Techniques to manage these symptoms Hydrate well Eat regular healthy meals Stay active Use relaxation techniques(deep breathing, meditating, yoga) Do Not substitute Alcohol to help with tapering If you have been on opioids for less  than two weeks and do not have pain than it is ok to stop all together.  Plan to wean off of opioids This plan should start within one week post op of your joint replacement. Maintain the same interval or time between taking each dose and first decrease the dose.  Cut the total daily intake of opioids by one tablet each day Next start to increase the time between doses. The last dose that should be eliminated is the evening dose.      TED hose   Complete by: As directed    Use stockings (TED hose) for 2 weeks on both leg(s).  You may remove them at night for sleeping.        Follow-up Information     Paralee Cancel, MD. Schedule an appointment as soon as possible for a visit in 2 week(s).   Specialty: Orthopedic Surgery Contact information: [address] [place] [address] [phone]                  Signed: Irving Copas 10/20/2021, 10:48 AM

## 2021-11-25 DIAGNOSIS — Z96651 Presence of right artificial knee joint: Secondary | ICD-10-CM | POA: Diagnosis not present

## 2021-11-25 DIAGNOSIS — Z471 Aftercare following joint replacement surgery: Secondary | ICD-10-CM | POA: Diagnosis not present

## 2022-04-06 DIAGNOSIS — U071 COVID-19: Secondary | ICD-10-CM | POA: Diagnosis not present

## 2022-05-26 DIAGNOSIS — K5909 Other constipation: Secondary | ICD-10-CM | POA: Diagnosis not present

## 2022-05-26 DIAGNOSIS — G4700 Insomnia, unspecified: Secondary | ICD-10-CM | POA: Diagnosis not present

## 2022-05-26 DIAGNOSIS — N1831 Chronic kidney disease, stage 3a: Secondary | ICD-10-CM | POA: Diagnosis not present

## 2022-05-26 DIAGNOSIS — Z299 Encounter for prophylactic measures, unspecified: Secondary | ICD-10-CM | POA: Diagnosis not present

## 2022-05-26 DIAGNOSIS — I10 Essential (primary) hypertension: Secondary | ICD-10-CM | POA: Diagnosis not present

## 2022-08-24 DIAGNOSIS — Q438 Other specified congenital malformations of intestine: Secondary | ICD-10-CM | POA: Diagnosis not present

## 2022-08-24 DIAGNOSIS — Z6831 Body mass index (BMI) 31.0-31.9, adult: Secondary | ICD-10-CM | POA: Diagnosis not present

## 2022-08-24 DIAGNOSIS — Z79899 Other long term (current) drug therapy: Secondary | ICD-10-CM | POA: Diagnosis not present

## 2022-08-24 DIAGNOSIS — I10 Essential (primary) hypertension: Secondary | ICD-10-CM | POA: Diagnosis not present

## 2022-08-24 DIAGNOSIS — Z7982 Long term (current) use of aspirin: Secondary | ICD-10-CM | POA: Diagnosis not present

## 2022-08-24 DIAGNOSIS — Z1211 Encounter for screening for malignant neoplasm of colon: Secondary | ICD-10-CM | POA: Diagnosis not present

## 2022-08-24 DIAGNOSIS — Z880 Allergy status to penicillin: Secondary | ICD-10-CM | POA: Diagnosis not present

## 2022-09-04 ENCOUNTER — Other Ambulatory Visit: Payer: Self-pay | Admitting: Surgery

## 2022-09-04 DIAGNOSIS — Z1211 Encounter for screening for malignant neoplasm of colon: Secondary | ICD-10-CM

## 2022-09-23 DIAGNOSIS — Z79899 Other long term (current) drug therapy: Secondary | ICD-10-CM | POA: Diagnosis not present

## 2022-09-23 DIAGNOSIS — Z7982 Long term (current) use of aspirin: Secondary | ICD-10-CM | POA: Diagnosis not present

## 2022-09-23 DIAGNOSIS — R002 Palpitations: Secondary | ICD-10-CM | POA: Diagnosis not present

## 2022-09-23 DIAGNOSIS — Z880 Allergy status to penicillin: Secondary | ICD-10-CM | POA: Diagnosis not present

## 2022-09-23 DIAGNOSIS — R55 Syncope and collapse: Secondary | ICD-10-CM | POA: Diagnosis not present

## 2022-09-23 DIAGNOSIS — R0602 Shortness of breath: Secondary | ICD-10-CM | POA: Diagnosis not present

## 2022-09-23 DIAGNOSIS — I10 Essential (primary) hypertension: Secondary | ICD-10-CM | POA: Diagnosis not present

## 2022-09-23 DIAGNOSIS — R531 Weakness: Secondary | ICD-10-CM | POA: Diagnosis not present

## 2022-09-26 DIAGNOSIS — N1831 Chronic kidney disease, stage 3a: Secondary | ICD-10-CM | POA: Diagnosis not present

## 2022-09-26 DIAGNOSIS — Z299 Encounter for prophylactic measures, unspecified: Secondary | ICD-10-CM | POA: Diagnosis not present

## 2022-09-26 DIAGNOSIS — D509 Iron deficiency anemia, unspecified: Secondary | ICD-10-CM | POA: Diagnosis not present

## 2022-09-26 DIAGNOSIS — I10 Essential (primary) hypertension: Secondary | ICD-10-CM | POA: Diagnosis not present

## 2022-10-20 ENCOUNTER — Ambulatory Visit
Admission: RE | Admit: 2022-10-20 | Discharge: 2022-10-20 | Disposition: A | Discharge status: Home / Self Care | Payer: Medicare Other | Source: Ambulatory Visit | Attending: Surgery | Admitting: Surgery

## 2022-10-20 DIAGNOSIS — Z1211 Encounter for screening for malignant neoplasm of colon: Secondary | ICD-10-CM

## 2022-10-20 DIAGNOSIS — Q438 Other specified congenital malformations of intestine: Secondary | ICD-10-CM | POA: Diagnosis not present

## 2022-10-20 DIAGNOSIS — K449 Diaphragmatic hernia without obstruction or gangrene: Secondary | ICD-10-CM | POA: Diagnosis not present

## 2022-11-10 DIAGNOSIS — Z79899 Other long term (current) drug therapy: Secondary | ICD-10-CM | POA: Diagnosis not present

## 2022-11-10 DIAGNOSIS — R5383 Other fatigue: Secondary | ICD-10-CM | POA: Diagnosis not present

## 2022-11-10 DIAGNOSIS — E7800 Pure hypercholesterolemia, unspecified: Secondary | ICD-10-CM | POA: Diagnosis not present

## 2022-11-10 DIAGNOSIS — E559 Vitamin D deficiency, unspecified: Secondary | ICD-10-CM | POA: Diagnosis not present

## 2022-11-10 DIAGNOSIS — E538 Deficiency of other specified B group vitamins: Secondary | ICD-10-CM | POA: Diagnosis not present

## 2022-12-04 DIAGNOSIS — M5416 Radiculopathy, lumbar region: Secondary | ICD-10-CM | POA: Diagnosis not present

## 2022-12-04 DIAGNOSIS — I10 Essential (primary) hypertension: Secondary | ICD-10-CM | POA: Diagnosis not present

## 2022-12-04 DIAGNOSIS — Z299 Encounter for prophylactic measures, unspecified: Secondary | ICD-10-CM | POA: Diagnosis not present

## 2022-12-04 DIAGNOSIS — Z6836 Body mass index (BMI) 36.0-36.9, adult: Secondary | ICD-10-CM | POA: Diagnosis not present

## 2023-01-04 DIAGNOSIS — Z299 Encounter for prophylactic measures, unspecified: Secondary | ICD-10-CM | POA: Diagnosis not present

## 2023-01-04 DIAGNOSIS — I10 Essential (primary) hypertension: Secondary | ICD-10-CM | POA: Diagnosis not present

## 2023-01-04 DIAGNOSIS — M5416 Radiculopathy, lumbar region: Secondary | ICD-10-CM | POA: Diagnosis not present

## 2023-01-04 DIAGNOSIS — Z6835 Body mass index (BMI) 35.0-35.9, adult: Secondary | ICD-10-CM | POA: Diagnosis not present

## 2023-01-12 DIAGNOSIS — M545 Low back pain, unspecified: Secondary | ICD-10-CM | POA: Diagnosis not present

## 2023-01-26 DIAGNOSIS — I10 Essential (primary) hypertension: Secondary | ICD-10-CM | POA: Diagnosis not present

## 2023-01-26 DIAGNOSIS — M5416 Radiculopathy, lumbar region: Secondary | ICD-10-CM | POA: Diagnosis not present

## 2023-01-26 DIAGNOSIS — M7138 Other bursal cyst, other site: Secondary | ICD-10-CM | POA: Diagnosis not present

## 2023-01-26 DIAGNOSIS — Z299 Encounter for prophylactic measures, unspecified: Secondary | ICD-10-CM | POA: Diagnosis not present

## 2023-02-09 DIAGNOSIS — Z6833 Body mass index (BMI) 33.0-33.9, adult: Secondary | ICD-10-CM | POA: Diagnosis not present

## 2023-02-09 DIAGNOSIS — M5416 Radiculopathy, lumbar region: Secondary | ICD-10-CM | POA: Diagnosis not present

## 2023-02-09 DIAGNOSIS — M4316 Spondylolisthesis, lumbar region: Secondary | ICD-10-CM | POA: Diagnosis not present

## 2023-02-23 DIAGNOSIS — Z1231 Encounter for screening mammogram for malignant neoplasm of breast: Secondary | ICD-10-CM | POA: Diagnosis not present
